# Patient Record
Sex: Female | Born: 1955 | Race: White | Hispanic: No | Smoking: Never smoker
Health system: Southern US, Community
[De-identification: ages and names within clinical notes are randomized; demographics above are authoritative.]

## PROBLEM LIST (undated history)

## (undated) DIAGNOSIS — D696 Thrombocytopenia, unspecified: Secondary | ICD-10-CM

## (undated) DIAGNOSIS — J449 Chronic obstructive pulmonary disease, unspecified: Secondary | ICD-10-CM

## (undated) DIAGNOSIS — F329 Major depressive disorder, single episode, unspecified: Secondary | ICD-10-CM

## (undated) DIAGNOSIS — E079 Disorder of thyroid, unspecified: Secondary | ICD-10-CM

## (undated) DIAGNOSIS — F32A Depression, unspecified: Secondary | ICD-10-CM

## (undated) DIAGNOSIS — J45909 Unspecified asthma, uncomplicated: Secondary | ICD-10-CM

## (undated) DIAGNOSIS — E785 Hyperlipidemia, unspecified: Secondary | ICD-10-CM

## (undated) DIAGNOSIS — R569 Unspecified convulsions: Secondary | ICD-10-CM

## (undated) DIAGNOSIS — G8929 Other chronic pain: Secondary | ICD-10-CM

## (undated) DIAGNOSIS — M1990 Unspecified osteoarthritis, unspecified site: Secondary | ICD-10-CM

## (undated) DIAGNOSIS — K219 Gastro-esophageal reflux disease without esophagitis: Secondary | ICD-10-CM

## (undated) DIAGNOSIS — I10 Essential (primary) hypertension: Secondary | ICD-10-CM

## (undated) DIAGNOSIS — E119 Type 2 diabetes mellitus without complications: Secondary | ICD-10-CM

## (undated) DIAGNOSIS — G43909 Migraine, unspecified, not intractable, without status migrainosus: Secondary | ICD-10-CM

## (undated) HISTORY — PX: CHOLECYSTECTOMY: SHX55

---

## 2014-04-16 HISTORY — PX: CARDIAC SURGERY: SHX584

## 2017-05-14 ENCOUNTER — Observation Stay (HOSPITAL_COMMUNITY)
Admission: EM | Admit: 2017-05-14 | Discharge: 2017-05-17 | Disposition: A | Discharge status: Home / Self Care | Payer: Medicaid - Out of State | Attending: Family Medicine | Admitting: Family Medicine

## 2017-05-14 ENCOUNTER — Other Ambulatory Visit: Payer: Self-pay

## 2017-05-14 ENCOUNTER — Emergency Department (HOSPITAL_COMMUNITY): Payer: Medicaid - Out of State

## 2017-05-14 ENCOUNTER — Encounter (HOSPITAL_COMMUNITY): Payer: Self-pay | Admitting: Emergency Medicine

## 2017-05-14 DIAGNOSIS — R569 Unspecified convulsions: Secondary | ICD-10-CM

## 2017-05-14 DIAGNOSIS — J449 Chronic obstructive pulmonary disease, unspecified: Secondary | ICD-10-CM | POA: Diagnosis not present

## 2017-05-14 DIAGNOSIS — R200 Anesthesia of skin: Secondary | ICD-10-CM | POA: Diagnosis present

## 2017-05-14 DIAGNOSIS — E785 Hyperlipidemia, unspecified: Secondary | ICD-10-CM | POA: Insufficient documentation

## 2017-05-14 DIAGNOSIS — R531 Weakness: Principal | ICD-10-CM | POA: Insufficient documentation

## 2017-05-14 DIAGNOSIS — Z882 Allergy status to sulfonamides status: Secondary | ICD-10-CM | POA: Insufficient documentation

## 2017-05-14 DIAGNOSIS — Z79899 Other long term (current) drug therapy: Secondary | ICD-10-CM | POA: Insufficient documentation

## 2017-05-14 DIAGNOSIS — E039 Hypothyroidism, unspecified: Secondary | ICD-10-CM | POA: Diagnosis not present

## 2017-05-14 DIAGNOSIS — Z79890 Hormone replacement therapy (postmenopausal): Secondary | ICD-10-CM | POA: Insufficient documentation

## 2017-05-14 DIAGNOSIS — Z7982 Long term (current) use of aspirin: Secondary | ICD-10-CM | POA: Diagnosis not present

## 2017-05-14 DIAGNOSIS — N182 Chronic kidney disease, stage 2 (mild): Secondary | ICD-10-CM | POA: Diagnosis not present

## 2017-05-14 DIAGNOSIS — Z91018 Allergy to other foods: Secondary | ICD-10-CM | POA: Diagnosis not present

## 2017-05-14 DIAGNOSIS — F329 Major depressive disorder, single episode, unspecified: Secondary | ICD-10-CM | POA: Diagnosis not present

## 2017-05-14 DIAGNOSIS — E079 Disorder of thyroid, unspecified: Secondary | ICD-10-CM | POA: Diagnosis not present

## 2017-05-14 DIAGNOSIS — Z7951 Long term (current) use of inhaled steroids: Secondary | ICD-10-CM | POA: Insufficient documentation

## 2017-05-14 DIAGNOSIS — Z881 Allergy status to other antibiotic agents status: Secondary | ICD-10-CM | POA: Insufficient documentation

## 2017-05-14 DIAGNOSIS — I10 Essential (primary) hypertension: Secondary | ICD-10-CM | POA: Diagnosis present

## 2017-05-14 DIAGNOSIS — E1122 Type 2 diabetes mellitus with diabetic chronic kidney disease: Secondary | ICD-10-CM | POA: Diagnosis not present

## 2017-05-14 DIAGNOSIS — R262 Difficulty in walking, not elsewhere classified: Secondary | ICD-10-CM | POA: Diagnosis not present

## 2017-05-14 DIAGNOSIS — I129 Hypertensive chronic kidney disease with stage 1 through stage 4 chronic kidney disease, or unspecified chronic kidney disease: Secondary | ICD-10-CM | POA: Insufficient documentation

## 2017-05-14 DIAGNOSIS — R339 Retention of urine, unspecified: Secondary | ICD-10-CM | POA: Diagnosis present

## 2017-05-14 DIAGNOSIS — F32A Depression, unspecified: Secondary | ICD-10-CM | POA: Diagnosis present

## 2017-05-14 DIAGNOSIS — R29898 Other symptoms and signs involving the musculoskeletal system: Secondary | ICD-10-CM | POA: Diagnosis not present

## 2017-05-14 DIAGNOSIS — D61818 Other pancytopenia: Secondary | ICD-10-CM | POA: Diagnosis not present

## 2017-05-14 DIAGNOSIS — G8929 Other chronic pain: Secondary | ICD-10-CM | POA: Diagnosis not present

## 2017-05-14 DIAGNOSIS — K219 Gastro-esophageal reflux disease without esophagitis: Secondary | ICD-10-CM | POA: Insufficient documentation

## 2017-05-14 DIAGNOSIS — Z7984 Long term (current) use of oral hypoglycemic drugs: Secondary | ICD-10-CM | POA: Insufficient documentation

## 2017-05-14 DIAGNOSIS — E119 Type 2 diabetes mellitus without complications: Secondary | ICD-10-CM

## 2017-05-14 DIAGNOSIS — Z880 Allergy status to penicillin: Secondary | ICD-10-CM | POA: Insufficient documentation

## 2017-05-14 DIAGNOSIS — Z792 Long term (current) use of antibiotics: Secondary | ICD-10-CM | POA: Insufficient documentation

## 2017-05-14 HISTORY — DX: Other chronic pain: G89.29

## 2017-05-14 HISTORY — DX: Unspecified convulsions: R56.9

## 2017-05-14 HISTORY — DX: Unspecified osteoarthritis, unspecified site: M19.90

## 2017-05-14 HISTORY — DX: Type 2 diabetes mellitus without complications: E11.9

## 2017-05-14 HISTORY — DX: Chronic obstructive pulmonary disease, unspecified: J44.9

## 2017-05-14 HISTORY — DX: Migraine, unspecified, not intractable, without status migrainosus: G43.909

## 2017-05-14 HISTORY — DX: Depression, unspecified: F32.A

## 2017-05-14 HISTORY — DX: Unspecified asthma, uncomplicated: J45.909

## 2017-05-14 HISTORY — DX: Hyperlipidemia, unspecified: E78.5

## 2017-05-14 HISTORY — DX: Major depressive disorder, single episode, unspecified: F32.9

## 2017-05-14 HISTORY — DX: Thrombocytopenia, unspecified: D69.6

## 2017-05-14 HISTORY — DX: Gastro-esophageal reflux disease without esophagitis: K21.9

## 2017-05-14 HISTORY — DX: Disorder of thyroid, unspecified: E07.9

## 2017-05-14 HISTORY — DX: Essential (primary) hypertension: I10

## 2017-05-14 LAB — BASIC METABOLIC PANEL
Anion gap: 9 (ref 5–15)
BUN: 21 mg/dL — AB (ref 6–20)
CHLORIDE: 101 mmol/L (ref 101–111)
CO2: 26 mmol/L (ref 22–32)
Calcium: 8.7 mg/dL — ABNORMAL LOW (ref 8.9–10.3)
Creatinine, Ser: 1.14 mg/dL — ABNORMAL HIGH (ref 0.44–1.00)
GFR calc Af Amer: 59 mL/min — ABNORMAL LOW (ref >60)
GFR, EST NON AFRICAN AMERICAN: 51 mL/min — AB (ref >60)
GLUCOSE: 82 mg/dL (ref 65–99)
POTASSIUM: 4 mmol/L (ref 3.5–5.1)
Sodium: 136 mmol/L (ref 135–145)

## 2017-05-14 LAB — CBC
HEMATOCRIT: 31.5 % — AB (ref 36.0–46.0)
HEMOGLOBIN: 10.9 g/dL — AB (ref 12.0–15.0)
MCH: 32.9 pg (ref 26.0–34.0)
MCHC: 34.6 g/dL (ref 30.0–36.0)
MCV: 95.2 fL (ref 78.0–100.0)
Platelets: 105 10*3/uL — ABNORMAL LOW (ref 150–400)
RBC: 3.31 MIL/uL — AB (ref 3.87–5.11)
RDW: 13 % (ref 11.5–15.5)
WBC: 3.2 10*3/uL — AB (ref 4.0–10.5)

## 2017-05-14 LAB — TYPE AND SCREEN
ABO/RH(D): O POS
Antibody Screen: NEGATIVE

## 2017-05-14 LAB — ABO/RH: ABO/RH(D): O POS

## 2017-05-14 MED ORDER — TOPIRAMATE 25 MG PO TABS
25.0000 mg | ORAL_TABLET | Freq: Every day | ORAL | Status: DC
  Administered 2017-05-15 – 2017-05-16 (×3): 25 mg via ORAL
  Filled 2017-05-14 (×3): qty 1

## 2017-05-14 MED ORDER — GABAPENTIN 300 MG PO CAPS
300.0000 mg | ORAL_CAPSULE | Freq: Three times a day (TID) | ORAL | Status: DC
  Administered 2017-05-15 – 2017-05-17 (×8): 300 mg via ORAL
  Filled 2017-05-14 (×8): qty 1

## 2017-05-14 MED ORDER — PROMETHAZINE HCL 25 MG PO TABS: 12.5000 mg | ORAL_TABLET | Freq: Four times a day (QID) | ORAL | Status: DC | PRN

## 2017-05-14 MED ORDER — FLUTICASONE FUROATE-VILANTEROL 200-25 MCG/INH IN AEPB
1.0000 | INHALATION_SPRAY | Freq: Every day | RESPIRATORY_TRACT | Status: DC
  Administered 2017-05-15 – 2017-05-17 (×3): 1 via RESPIRATORY_TRACT
  Filled 2017-05-14: qty 28

## 2017-05-14 MED ORDER — LEVOTHYROXINE SODIUM 150 MCG PO TABS
150.0000 ug | ORAL_TABLET | Freq: Every day | ORAL | Status: DC
  Administered 2017-05-15 – 2017-05-17 (×3): 150 ug via ORAL
  Filled 2017-05-14: qty 1
  Filled 2017-05-14 (×2): qty 2
  Filled 2017-05-14: qty 1
  Filled 2017-05-14: qty 2
  Filled 2017-05-14: qty 1
  Filled 2017-05-14: qty 2

## 2017-05-14 MED ORDER — DIVALPROEX SODIUM 500 MG PO DR TAB
1000.0000 mg | DELAYED_RELEASE_TABLET | Freq: Every day | ORAL | Status: DC
  Administered 2017-05-15 – 2017-05-17 (×3): 1000 mg via ORAL
  Filled 2017-05-14 (×3): qty 2

## 2017-05-14 MED ORDER — ATENOLOL 50 MG PO TABS
25.0000 mg | ORAL_TABLET | Freq: Every day | ORAL | Status: DC
  Administered 2017-05-15 – 2017-05-17 (×3): 25 mg via ORAL
  Filled 2017-05-14 (×3): qty 1

## 2017-05-14 MED ORDER — MAGNESIUM OXIDE 400 MG PO TABS: 400.0000 mg | ORAL_TABLET | Freq: Two times a day (BID) | ORAL | Status: DC

## 2017-05-14 MED ORDER — HYDROCODONE-ACETAMINOPHEN 5-325 MG PO TABS
1.0000 | ORAL_TABLET | ORAL | Status: DC | PRN
  Administered 2017-05-16: 2 via ORAL
  Administered 2017-05-17: 1 via ORAL
  Filled 2017-05-14: qty 1
  Filled 2017-05-14: qty 2

## 2017-05-14 MED ORDER — FLUTICASONE PROPIONATE 50 MCG/ACT NA SUSP
1.0000 | Freq: Every day | NASAL | Status: DC
  Administered 2017-05-15 – 2017-05-17 (×3): 1 via NASAL
  Filled 2017-05-14: qty 16

## 2017-05-14 MED ORDER — ACETAMINOPHEN 650 MG RE SUPP: 650.0000 mg | Freq: Four times a day (QID) | RECTAL | Status: DC | PRN

## 2017-05-14 MED ORDER — LORAZEPAM 2 MG/ML IJ SOLN
0.5000 mg | Freq: Once | INTRAMUSCULAR | Status: AC
  Administered 2017-05-14: 0.5 mg via INTRAVENOUS
  Filled 2017-05-14: qty 1

## 2017-05-14 MED ORDER — MONTELUKAST SODIUM 10 MG PO TABS
10.0000 mg | ORAL_TABLET | Freq: Every day | ORAL | Status: DC
  Administered 2017-05-15 – 2017-05-16 (×3): 10 mg via ORAL
  Filled 2017-05-14 (×5): qty 1

## 2017-05-14 MED ORDER — ACETAMINOPHEN 325 MG PO TABS
650.0000 mg | ORAL_TABLET | Freq: Four times a day (QID) | ORAL | Status: DC | PRN
  Administered 2017-05-17: 650 mg via ORAL
  Filled 2017-05-14: qty 2

## 2017-05-14 MED ORDER — SODIUM CHLORIDE 0.9% FLUSH
3.0000 mL | Freq: Two times a day (BID) | INTRAVENOUS | Status: DC
  Administered 2017-05-15 – 2017-05-17 (×5): 3 mL via INTRAVENOUS

## 2017-05-14 MED ORDER — SIMVASTATIN 20 MG PO TABS
20.0000 mg | ORAL_TABLET | Freq: Every day | ORAL | Status: DC
  Administered 2017-05-15 – 2017-05-16 (×3): 20 mg via ORAL
  Filled 2017-05-14 (×4): qty 1

## 2017-05-14 MED ORDER — MAGNESIUM OXIDE 400 (241.3 MG) MG PO TABS
800.0000 mg | ORAL_TABLET | Freq: Every day | ORAL | Status: DC
  Administered 2017-05-15 – 2017-05-17 (×3): 800 mg via ORAL
  Filled 2017-05-14 (×3): qty 2

## 2017-05-14 MED ORDER — SUCRALFATE 1 G PO TABS
1.0000 g | ORAL_TABLET | Freq: Three times a day (TID) | ORAL | Status: DC
  Administered 2017-05-15 – 2017-05-17 (×11): 1 g via ORAL
  Filled 2017-05-14 (×12): qty 1

## 2017-05-14 MED ORDER — PRAZOSIN HCL 1 MG PO CAPS
1.0000 mg | ORAL_CAPSULE | Freq: Every day | ORAL | Status: DC
  Administered 2017-05-15 – 2017-05-16 (×3): 1 mg via ORAL
  Filled 2017-05-14 (×3): qty 1

## 2017-05-14 MED ORDER — SENNOSIDES-DOCUSATE SODIUM 8.6-50 MG PO TABS: 1.0000 | ORAL_TABLET | Freq: Every evening | ORAL | Status: DC | PRN

## 2017-05-14 MED ORDER — CLONAZEPAM 0.5 MG PO TABS
0.5000 mg | ORAL_TABLET | Freq: Two times a day (BID) | ORAL | Status: DC
  Administered 2017-05-15 – 2017-05-17 (×6): 0.5 mg via ORAL
  Filled 2017-05-14 (×6): qty 1

## 2017-05-14 MED ORDER — SODIUM CHLORIDE 0.9% FLUSH: 3.0000 mL | INTRAVENOUS | Status: DC | PRN

## 2017-05-14 MED ORDER — MAGNESIUM OXIDE 400 (241.3 MG) MG PO TABS
400.0000 mg | ORAL_TABLET | Freq: Every day | ORAL | Status: DC
  Administered 2017-05-15 – 2017-05-16 (×3): 400 mg via ORAL
  Filled 2017-05-14 (×3): qty 1

## 2017-05-14 MED ORDER — VENLAFAXINE HCL ER 150 MG PO CP24
150.0000 mg | ORAL_CAPSULE | Freq: Every day | ORAL | Status: DC
  Administered 2017-05-15 – 2017-05-17 (×3): 150 mg via ORAL
  Filled 2017-05-14: qty 2
  Filled 2017-05-14: qty 1
  Filled 2017-05-14 (×2): qty 2
  Filled 2017-05-14 (×2): qty 1

## 2017-05-14 MED ORDER — ASPIRIN EC 81 MG PO TBEC
81.0000 mg | DELAYED_RELEASE_TABLET | Freq: Every day | ORAL | Status: DC
  Administered 2017-05-15 – 2017-05-17 (×3): 81 mg via ORAL
  Filled 2017-05-14 (×3): qty 1

## 2017-05-14 MED ORDER — BUMETANIDE 1 MG PO TABS
1.0000 mg | ORAL_TABLET | ORAL | Status: DC
  Administered 2017-05-15 – 2017-05-17 (×2): 1 mg via ORAL
  Filled 2017-05-14 (×2): qty 1

## 2017-05-14 MED ORDER — DICYCLOMINE HCL 20 MG PO TABS
20.0000 mg | ORAL_TABLET | Freq: Three times a day (TID) | ORAL | Status: DC
  Administered 2017-05-15 – 2017-05-17 (×8): 20 mg via ORAL
  Filled 2017-05-14 (×8): qty 1

## 2017-05-14 MED ORDER — DIVALPROEX SODIUM 250 MG PO DR TAB
750.0000 mg | DELAYED_RELEASE_TABLET | Freq: Every day | ORAL | Status: DC
  Administered 2017-05-15 – 2017-05-16 (×3): 750 mg via ORAL
  Filled 2017-05-14 (×3): qty 3

## 2017-05-14 MED ORDER — VITAMIN B-12 1000 MCG PO TABS
1000.0000 ug | ORAL_TABLET | Freq: Every day | ORAL | Status: DC
  Administered 2017-05-15 – 2017-05-17 (×3): 1000 ug via ORAL
  Filled 2017-05-14 (×3): qty 1

## 2017-05-14 MED ORDER — ISOSORBIDE MONONITRATE ER 30 MG PO TB24
30.0000 mg | ORAL_TABLET | Freq: Every day | ORAL | Status: DC
  Administered 2017-05-15 – 2017-05-17 (×3): 30 mg via ORAL
  Filled 2017-05-14 (×3): qty 1

## 2017-05-14 MED ORDER — SODIUM CHLORIDE 0.9% FLUSH
3.0000 mL | Freq: Two times a day (BID) | INTRAVENOUS | Status: DC
  Administered 2017-05-16 – 2017-05-17 (×3): 3 mL via INTRAVENOUS

## 2017-05-14 MED ORDER — POTASSIUM CHLORIDE CRYS ER 10 MEQ PO TBCR
10.0000 meq | EXTENDED_RELEASE_TABLET | Freq: Every day | ORAL | Status: DC
  Administered 2017-05-15 – 2017-05-17 (×3): 10 meq via ORAL
  Filled 2017-05-14 (×3): qty 1

## 2017-05-14 MED ORDER — PANTOPRAZOLE SODIUM 40 MG PO TBEC
40.0000 mg | DELAYED_RELEASE_TABLET | Freq: Every day | ORAL | Status: DC
  Administered 2017-05-15 – 2017-05-17 (×3): 40 mg via ORAL
  Filled 2017-05-14 (×3): qty 1

## 2017-05-14 MED ORDER — SODIUM CHLORIDE 0.9 % IV SOLN: 250.0000 mL | INTRAVENOUS | Status: DC | PRN

## 2017-05-14 MED ORDER — ALBUTEROL SULFATE (2.5 MG/3ML) 0.083% IN NEBU: 3.0000 mL | INHALATION_SOLUTION | Freq: Four times a day (QID) | RESPIRATORY_TRACT | Status: DC | PRN

## 2017-05-14 MED ORDER — ARIPIPRAZOLE 2 MG PO TABS
2.0000 mg | ORAL_TABLET | Freq: Every day | ORAL | Status: DC
  Administered 2017-05-15 – 2017-05-17 (×3): 2 mg via ORAL
  Filled 2017-05-14 (×3): qty 1

## 2017-05-14 NOTE — H&P (Signed)
History and Physical    Jamie Shea PJA:[id number] DOB: [birth year] DOA: 05/14/2017  PCP: System, Pcp Not In; Tory Emerald, FNP   Patient coming from: Home, by way of [facility]   Chief Complaint: Bilateral leg weakness and numbness   HPI: Jamie Shea is a 62 y.o. female with medical history significant for COPD with chronic hypoxic respiratory failure, seizure disorder, hypertension, depression, anxiety, and chronic pain, now presenting to the emergency to an outside emergency department for evaluation of bilateral lower extremity numbness and weakness.  Patient has reported that she has been experiencing increased pain in both of the lower extremities over the past few months and has fallen a few times secondary to this.  She reports getting up to use the bathroom earlier today, but was unable to walk due to bilateral leg numbness and weakness.  She denies any headache, change in vision or hearing, upper extremity numbness or weakness, or acute low back pain.  No recent fevers or chills.  She was noted to have urinary retention at the outside hospital, Foley catheter was placed, and there was concern for cauda equina, and so the patient was transferred here for MRI.  ED Course: Upon arrival to the ED, patient is found to be afebrile, saturating low 90s, mildly tachypneic, and with vitals otherwise normal.  MRI of the lumbar and thoracic spine are negative for acute abnormality or cord compression.  Mr. panel is notable for a creatinine of 1.14, consistent with her apparent baseline.  CBC features a pancytopenia with WBC 3200, hemoglobin 10.9, and platelets 105,000.  Neurology was consulted by the ED physician and recommends a medical patient has remained hemodynamically stable, is respiratory distress, and will be observed on the telemetry unit for ongoing evaluation and extremity numbness and weakness.  Review of Systems:  All other systems reviewed and apart from HPI, are  negative.  Past Medical History:  Diagnosis Date  . Arthritis   . Asthma   . Chronic pain   . COPD (chronic obstructive pulmonary disease) ([place])   . Depression   . Diabetes mellitus without complication ([place])   . GERD (gastroesophageal reflux disease)   . Hyperlipidemia   . Hypertension   . Migraines   . Seizures ([place])   . Thrombocytopenia ([place])   . Thyroid disease     Past Surgical History:  Procedure Laterality Date  . CARDIAC SURGERY  2016   cath  . CHOLECYSTECTOMY       reports that  has never smoked. she has never used smokeless tobacco. She reports that she does not drink alcohol or use drugs.  Allergies  Allergen Reactions  . Amoxicillin Hives  . Azithromycin Hives  . Corn-Containing Products Diarrhea  . Pea Extract Diarrhea  . Penicillins   . Sulfa Antibiotics     History reviewed. No pertinent family history.   Prior to Admission medications   Medication Sig Start Date End Date Taking? Authorizing Provider  acetaminophen (TYLENOL) 500 MG tablet Take 1,000 mg by mouth every 4 (four) hours as needed for mild pain.   Yes [provider]  albuterol (PROVENTIL HFA;VENTOLIN HFA) 108 (90 Base) MCG/ACT inhaler Inhale 1-2 puffs into the lungs every 6 (six) hours as needed for wheezing or shortness of breath.   Yes [provider]  ARIPiprazole (ABILIFY) 2 MG tablet Take 2 mg by mouth daily.   Yes [provider]  aspirin EC 81 MG tablet Take 81 mg by mouth daily.  Yes [provider]  atenolol (TENORMIN) 50 MG tablet Take 25 mg by mouth daily.   Yes [provider]  azithromycin (ZITHROMAX) 250 MG tablet Take by mouth See admin instructions. Takes one tablet  266m every  Monday, Wednesday,Friday each week   Yes [provider]  bumetanide (BUMEX) 1 MG tablet Take 1 mg by mouth every other day.   Yes [provider]  clonazePAM (KLONOPIN) 1 MG tablet Take 0.5 mg by mouth 2 (two) times daily.   Yes  [provider]  dicyclomine (BENTYL) 20 MG tablet Take 20 mg by mouth 3 (three) times daily before meals.   Yes [provider]  diphenoxylate-atropine (LOMOTIL) 2.5-0.025 MG tablet Take 1 tablet by mouth 3 (three) times daily with meals.   Yes [provider]  ferrous sulfate 325 (65 FE) MG tablet Take 325 mg by mouth 2 (two) times daily with a meal.   Yes [provider]  fluticasone (FLONASE) 50 MCG/ACT nasal spray Place 1 spray into both nostrils daily.   Yes [provider]  fluticasone furoate-vilanterol (BREO ELLIPTA) 200-25 MCG/INH AEPB Inhale 1 puff into the lungs daily.   Yes [provider]  gabapentin (NEURONTIN) 300 MG capsule Take 300 mg by mouth 3 (three) times daily.   Yes [provider]  ibuprofen (ADVIL,MOTRIN) 800 MG tablet Take 800 mg by mouth every 6 (six) hours as needed for moderate pain.   Yes [provider]  isosorbide mononitrate (IMDUR) 30 MG 24 hr tablet Take 30 mg by mouth daily.   Yes [provider]  levothyroxine (SYNTHROID, LEVOTHROID) 150 MCG tablet Take 150 mcg by mouth daily before breakfast.   Yes [provider]  magnesium oxide (MAG-OX) 400 MG tablet Take 400-800 mg by mouth 2 (two) times daily. Patient taking 2 tablets (8029m in the morning and one tablet at night   Yes [provider]  metFORMIN (GLUCOPHAGE) 1000 MG tablet Take 500 mg by mouth 2 (two) times daily with a meal. Takes 50037mOne-half tablet twice daily   Yes [provider]  montelukast (SINGULAIR) 10 MG tablet Take 10 mg by mouth at bedtime.   Yes [provider]  nitroGLYCERIN (NITROSTAT) 0.4 MG SL tablet Place 0.4 mg under the tongue every 5 (five) minutes as needed for chest pain.   Yes [provider]  nystatin cream (MYCOSTATIN) Apply 1 application topically 2 (two) times daily.   Yes [provider]  OXYGEN Inhale 2 L into the lungs at bedtime.   Yes  [provider]  pantoprazole (PROTONIX) 40 MG tablet Take 40 mg by mouth daily.   Yes [provider]  potassium chloride (K-DUR) 10 MEQ tablet Take 10 mEq by mouth daily.   Yes [provider]  prazosin (MINIPRESS) 1 MG capsule Take 1 mg by mouth at bedtime.   Yes [provider]  simvastatin (ZOCOR) 20 MG tablet Take 20 mg by mouth daily at 6 PM.   Yes [provider]  sucralfate (CARAFATE) 1 g tablet Take 1 g by mouth 4 (four) times daily -  with meals and at bedtime.   Yes [provider]  topiramate (TOPAMAX) 25 MG tablet Take 25 mg by mouth at bedtime.   Yes [provider]  Valproic Acid 250 MG CPDR Take 750-1,000 mg by mouth 2 (two) times daily. Takes four capsules (1000 mg) in the morning and 3 capsules (750m52mt bedtime.   Yes [provider]  Venlafaxine HCl 150 MG TB24 Take 150 mg by mouth daily.   Yes [provider]  vitamin B-12 (CYANOCOBALAMIN) 1000 MCG tablet Take 1,000 mcg by mouth daily.   Yes [provider]  Vitamin D, Ergocalciferol, (DRISDOL) 50000 units CAPS capsule Take 50,000 Units by mouth every 7 (seven) days.   Yes [provider]    Physical Exam: Vitals:   05/14/17 1700 05/14/17 1715 05/14/17 1730 05/14/17 1800  BP: 116/63 121/67 130/73 114/68  Pulse: (!) 54 (!) 51 (!) 52 (!) 56  Resp: _0 Temp:      TempSrc:      SpO2: 95% 95% 97% 97%  Weight:      Height:          Constitutional: NAD, calm, obese Eyes: PERTLA, lids and conjunctivae normal ENMT: Mucous membranes are moist. Posterior pharynx clear of any exudate or lesions.   Neck: normal, supple, no masses, no thyromegaly Respiratory: Breath sounds diminished bilaterally, no wheezing, no crackles. Normal respiratory effort.    Cardiovascular: S1 & S2 heard, regular rate and rhythm. No significant JVD. Abdomen: No distension, no tenderness, no masses palpated. Bowel sounds normal.    Musculoskeletal: no clubbing / cyanosis. No joint deformity upper and lower extremities.  Skin: no significant rashes, lesions, ulcers. Warm, dry, well-perfused. Neurologic: No facial asymmetry. Sensation to light touch diminished throughout the bilateral LE's. Strength 5/5 in upper extremities, 2-3/5 throughout bilateral LE's.  Psychiatric: Alert. Oriented x 3.     Labs on Admission: I have personally reviewed following labs and imaging studies  CBC: Recent Labs  Lab 05/14/17 0950  WBC 3.2*  HGB 10.9*  HCT 31.5*  MCV 95.2  PLT 481*   Basic Metabolic Panel: Recent Labs  Lab 05/14/17 0950  NA 136  K 4.0  CL 101  CO2 26  GLUCOSE 82  BUN 21*  CREATININE 1.14*  CALCIUM 8.7*   GFR: Estimated Creatinine Clearance: 58.7 mL/min (A) (by C-G formula based on SCr of 1.14 mg/dL (H)). Liver Function Tests: No results for input(s): AST, ALT, ALKPHOS, BILITOT, PROT, ALBUMIN in the last 168 hours. No results for input(s): LIPASE, AMYLASE in the last 168 hours. No results for input(s): AMMONIA in the last 168 hours. Coagulation Profile: No results for input(s): INR, PROTIME in the last 168 hours. Cardiac Enzymes: No results for input(s): CKTOTAL, CKMB, CKMBINDEX, TROPONINI in the last 168 hours. BNP (last 3 results) No results for input(s): PROBNP in the last 8760 hours. HbA1C: No results for input(s): HGBA1C in the last 72 hours. CBG: No results for input(s): GLUCAP in the last 168 hours. Lipid Profile: No results for input(s): CHOL, HDL, LDLCALC, TRIG, CHOLHDL, LDLDIRECT in the last 72 hours. Thyroid Function Tests: No results for input(s): TSH, T4TOTAL, FREET4, T3FREE, THYROIDAB in the last 72 hours. Anemia Panel: No results for input(s): VITAMINB12, FOLATE, FERRITIN, TIBC, IRON, RETICCTPCT in the last 72 hours. Urine analysis: No results found for: COLORURINE, APPEARANCEUR, LABSPEC, PHURINE, GLUCOSEU, HGBUR, BILIRUBINUR, KETONESUR, PROTEINUR, UROBILINOGEN, NITRITE,  LEUKOCYTESUR Sepsis Labs: _1 (procalcitonin:4,lacticidven:4) )No results found for this or any previous visit (from the past 240 hour(s)).   Radiological Exams on Admission: Mr Thoracic Spine Wo Contrast  Result Date: 05/14/2017 CLINICAL DATA:  Initial evaluation for acute loss of feeling to bilateral lower extremities with fall last night. EXAM: MRI THORACIC AND LUMBAR SPINE WITHOUT CONTRAST TECHNIQUE: Multiplanar and multiecho pulse sequences of the thoracic and lumbar spine were obtained without intravenous contrast.  COMPARISON:  None. FINDINGS: MRI THORACIC SPINE FINDINGS Alignment: Vertebral bodies normally aligned with preservation of the normal thoracic kyphosis. No listhesis. Vertebrae: Vertebral body heights are maintained without evidence for acute or chronic fracture. Bone marrow signal intensity within normal limits. Mild reactive endplate changes noted about the T9-10 interspace anteriorly. Subcentimeter benign hemangioma noted within the T12 vertebral body. No other discrete or worrisome osseous lesions. Cord: Signal intensity within the thoracic spinal cord is normal. Conus medullaris terminates below the T12 level. Paraspinal and other soft tissues: Paraspinous soft tissues demonstrate no acute abnormality. Patchy opacity noted within the posterior right upper lobe, which may reflect atelectasis or infiltrate. Disc levels: No significant disc pathology seen within the thoracic spine. Mild bilateral facet hypertrophy seen from the T7-8 through T11-12 levels. No significant canal stenosis. Mild bilateral foraminal narrowing at T8-9. MRI LUMBAR SPINE FINDINGS Segmentation: Normal segmentation. Lowest well-formed disc labeled the L5-S1 level. Alignment: Trace retrolisthesis of L2 on L3. Vertebral bodies otherwise normally aligned with preservation of the normal lumbar lordosis. Vertebrae: Vertebral body heights are well maintained without evidence for acute or chronic fracture. Bone marrow  signal intensity within normal limits. Mild reactive endplate changes noted about the L2-3 interspace anteriorly. No discrete or worrisome osseous lesions. No abnormal marrow edema. Conus medullaris and cauda equina: Conus extends to the L1 level. Conus and cauda equina appear normal. Paraspinal and other soft tissues: Paraspinous soft tissues within normal limits. Visualized visceral structures unremarkable. Disc levels: L1-2:  Unremarkable. L2-3: Trace retrolisthesis. Mild diffuse disc bulge with disc desiccation and intervertebral disc space narrowing. Mild facet and ligament flavum hypertrophy. No significant canal or foraminal stenosis. L3-4: Mild diffuse disc bulge, slightly eccentric to the left. Mild bilateral facet and ligament flavum hypertrophy. Mild left lateral recess narrowing without significant canal stenosis. No significant foraminal encroachment. L4-5: Minimal disc bulge. Mild to moderate facet and ligamentum flavum hypertrophy. Trace joint effusion present on the right. No significant canal stenosis. Mild right L4 foraminal narrowing. No significant left foraminal encroachment. L5-S1: Mild diffuse disc bulge with disc desiccation. Associated chronic reactive endplate changes with marginal endplate osteophytic spurring. Mild facet and ligament flavum hypertrophy. No significant canal or lateral recess stenosis. Mild bilateral L5 foraminal narrowing. IMPRESSION: MR THORACIC SPINE IMPRESSION 1. No acute abnormality within the thoracic spine. No evidence for cord compression. 2. Mild bilateral facet hypertrophy at T7-8 through T11-12 without significant stenosis. MR LUMBAR SPINE IMPRESSION 1. No acute abnormality within the lumbar spine. No evidence for cord compression. 2. Mild degenerative disc disease for age without significant canal stenosis. 3. Mild right L4 and bilateral L5 foraminal stenosis related to disc bulge and facet disease. Electronically Signed   By: Jeannine Boga M.D.   On:  05/14/2017 14:52   Mr Lumbar Spine Wo Contrast  Result Date: 05/14/2017 CLINICAL DATA:  Initial evaluation for acute loss of feeling to bilateral lower extremities with fall last night. EXAM: MRI THORACIC AND LUMBAR SPINE WITHOUT CONTRAST TECHNIQUE: Multiplanar and multiecho pulse sequences of the thoracic and lumbar spine were obtained without intravenous contrast. COMPARISON:  None. FINDINGS: MRI THORACIC SPINE FINDINGS Alignment: Vertebral bodies normally aligned with preservation of the normal thoracic kyphosis. No listhesis. Vertebrae: Vertebral body heights are maintained without evidence for acute or chronic fracture. Bone marrow signal intensity within normal limits. Mild reactive endplate changes noted about the T9-10 interspace anteriorly. Subcentimeter benign hemangioma noted within the T12 vertebral body. No other discrete or worrisome osseous lesions. Cord: Signal intensity within the thoracic spinal  cord is normal. Conus medullaris terminates below the T12 level. Paraspinal and other soft tissues: Paraspinous soft tissues demonstrate no acute abnormality. Patchy opacity noted within the posterior right upper lobe, which may reflect atelectasis or infiltrate. Disc levels: No significant disc pathology seen within the thoracic spine. Mild bilateral facet hypertrophy seen from the T7-8 through T11-12 levels. No significant canal stenosis. Mild bilateral foraminal narrowing at T8-9. MRI LUMBAR SPINE FINDINGS Segmentation: Normal segmentation. Lowest well-formed disc labeled the L5-S1 level. Alignment: Trace retrolisthesis of L2 on L3. Vertebral bodies otherwise normally aligned with preservation of the normal lumbar lordosis. Vertebrae: Vertebral body heights are well maintained without evidence for acute or chronic fracture. Bone marrow signal intensity within normal limits. Mild reactive endplate changes noted about the L2-3 interspace anteriorly. No discrete or worrisome osseous lesions. No abnormal  marrow edema. Conus medullaris and cauda equina: Conus extends to the L1 level. Conus and cauda equina appear normal. Paraspinal and other soft tissues: Paraspinous soft tissues within normal limits. Visualized visceral structures unremarkable. Disc levels: L1-2:  Unremarkable. L2-3: Trace retrolisthesis. Mild diffuse disc bulge with disc desiccation and intervertebral disc space narrowing. Mild facet and ligament flavum hypertrophy. No significant canal or foraminal stenosis. L3-4: Mild diffuse disc bulge, slightly eccentric to the left. Mild bilateral facet and ligament flavum hypertrophy. Mild left lateral recess narrowing without significant canal stenosis. No significant foraminal encroachment. L4-5: Minimal disc bulge. Mild to moderate facet and ligamentum flavum hypertrophy. Trace joint effusion present on the right. No significant canal stenosis. Mild right L4 foraminal narrowing. No significant left foraminal encroachment. L5-S1: Mild diffuse disc bulge with disc desiccation. Associated chronic reactive endplate changes with marginal endplate osteophytic spurring. Mild facet and ligament flavum hypertrophy. No significant canal or lateral recess stenosis. Mild bilateral L5 foraminal narrowing. IMPRESSION: MR THORACIC SPINE IMPRESSION 1. No acute abnormality within the thoracic spine. No evidence for cord compression. 2. Mild bilateral facet hypertrophy at T7-8 through T11-12 without significant stenosis. MR LUMBAR SPINE IMPRESSION 1. No acute abnormality within the lumbar spine. No evidence for cord compression. 2. Mild degenerative disc disease for age without significant canal stenosis. 3. Mild right L4 and bilateral L5 foraminal stenosis related to disc bulge and facet disease. Electronically Signed   By: Jeannine Boga M.D.   On: 05/14/2017 14:52    EKG: Not performed.   Assessment/Plan  1. Bilateral leg weakness - Presents with numbness and weakness involving bilateral LE's, sent from  outside ED for MRI lumbar spine  - Normal ESR and CRP and non-acute head CT at outside hospital - MRI lumbar and thoracic spine negative for acute abnormality or cord-compression  - Neurology is consulting and much appreciated, will follow-up on recommendations, ask PT to eval and tx, continue supportive care    2. COPD, chronic hypoxic respiratory failure  - Stable on admission with no dyspnea or wheezing  - Continue ICS/LABA, supplemental O2, and prn albuterol   3. Depression, anxiety  - Continue Effexor, Klonopin    4. Seizure disorder  - Continue Topamax and Depakote   5. Hypothyroidism  - Continue Synthroid   6. Pancytopenia  - WBC is 3,200 on admission with Hgb 10.9 and platelets 105,000  - She has had intermittent leukopenia and normocytic anemia going back many years, and has documented hx of thrombocytopenia  - No evidence for infection or bleeding on admission  - Type and screen has been performed, will check anemia panel    7. CKD stage II  - SCr  is 1.14 on admission, consistent with her apparent baseline  - Renally-dose medications, avoid nephrotoxins    DVT prophylaxis: SCD's  Code Status: Full  Family Communication: Discussed with patient Disposition Plan: Observe on telemetry Consults called: Neurology Admission status: Observation   Vianne Bulls, MD [facility] Pager [phone]  If 7PM-7AM, please contact night-coverage [email or web address] Password [email or web address]  05/14/2017, 7:50 PM

## 2017-05-14 NOTE — Consult Note (Signed)
NEURO HOSPITALIST CONSULT NOTE   Requestig physician: Dr. Clarene Duke   Reason for Consult:   abnormal gait   History obtained from: Unclear patient is encephalopathic but cannot give a good history  HPI:                                                                                                                                          Jamie Shea is an 62 y.o. female with history of thrombocytopenia, thyroid disease, seizures, migraines, hypertension, hyperlipidemia, depression, chronic pain, and psychotic features.  I called the daughter who stated that yesterday she was walking to the bathroom and she was doing fine.  Apparently this morning she went to the bathroom and when she called her daughter to get out of the bed she slid off the bed and stated she could not feel or move her legs. "However the daughter did state that when the ambulance came she was able to move her legs and wiggle her toes and move her ankles."  When asked if she does this often due to her depression, the daughter did state that she often times does this with her.  She will state that she cannot do something such as moving a limb however if distracted or left alone the room and looked in the room she does move these extremities.  For this reason she now goes all the doctor's office because she is afraid that some of the information is not true.  Currently patient is extremely drowsy secondary to 2 mg of Ativan, she cannot give me a good history, she in a properly does not answer any questions that I ask her.  The questions that I asked her than answered with answers that make absolutely no correlation with what I asked.   Labs of importance BUN 21 Creatinine 1.14 Calcium 8.7 White blood cell count 3.2  Past Medical History:  Diagnosis Date  . Arthritis   . Asthma   . Chronic pain   . COPD (chronic obstructive pulmonary disease) (HCC)   . Depression   . Diabetes mellitus without  complication (HCC)   . GERD (gastroesophageal reflux disease)   . Hyperlipidemia   . Hypertension   . Migraines   . Seizures (HCC)   . Thrombocytopenia (HCC)   . Thyroid disease     Past Surgical History:  Procedure Laterality Date  . CARDIAC SURGERY  2016   cath  . CHOLECYSTECTOMY      No family history on file.   Social History:  reports that  has never smoked. she has never used smokeless tobacco. She reports that she does not drink alcohol or use drugs.  Allergies  Allergen Reactions  . Amoxicillin Hives  . Azithromycin Hives  . Corn-Containing Products Diarrhea  .  Pea Extract Diarrhea  . Penicillins   . Sulfa Antibiotics     MEDICATIONS:                                                                                                                     No current facility-administered medications for this encounter.    Current Outpatient Medications  Medication Sig Dispense Refill  . acetaminophen (TYLENOL) 500 MG tablet Take 1,000 mg by mouth every 4 (four) hours as needed for mild pain.    Marland Kitchen. albuterol (PROVENTIL HFA;VENTOLIN HFA) 108 (90 Base) MCG/ACT inhaler Inhale 1-2 puffs into the lungs every 6 (six) hours as needed for wheezing or shortness of breath.    . ARIPiprazole (ABILIFY) 2 MG tablet Take 2 mg by mouth daily.    Marland Kitchen. aspirin EC 81 MG tablet Take 81 mg by mouth daily.    Marland Kitchen. atenolol (TENORMIN) 50 MG tablet Take 25 mg by mouth daily.    Marland Kitchen. azithromycin (ZITHROMAX) 250 MG tablet Take by mouth See admin instructions. Takes one tablet  250mg  every  Monday, Wednesday,Friday each week    . bumetanide (BUMEX) 1 MG tablet Take 1 mg by mouth every other day.    . clonazePAM (KLONOPIN) 1 MG tablet Take 0.5 mg by mouth 2 (two) times daily.    Marland Kitchen. dicyclomine (BENTYL) 20 MG tablet Take 20 mg by mouth 3 (three) times daily before meals.    . diphenoxylate-atropine (LOMOTIL) 2.5-0.025 MG tablet Take 1 tablet by mouth 3 (three) times daily with meals.    . ferrous sulfate  325 (65 FE) MG tablet Take 325 mg by mouth 2 (two) times daily with a meal.    . fluticasone (FLONASE) 50 MCG/ACT nasal spray Place 1 spray into both nostrils daily.    . fluticasone furoate-vilanterol (BREO ELLIPTA) 200-25 MCG/INH AEPB Inhale 1 puff into the lungs daily.    Marland Kitchen. gabapentin (NEURONTIN) 300 MG capsule Take 300 mg by mouth 3 (three) times daily.    Marland Kitchen. ibuprofen (ADVIL,MOTRIN) 800 MG tablet Take 800 mg by mouth every 6 (six) hours as needed for moderate pain.    . isosorbide mononitrate (IMDUR) 30 MG 24 hr tablet Take 30 mg by mouth daily.    Marland Kitchen. levothyroxine (SYNTHROID, LEVOTHROID) 150 MCG tablet Take 150 mcg by mouth daily before breakfast.    . magnesium oxide (MAG-OX) 400 MG tablet Take 400-800 mg by mouth 2 (two) times daily. Patient taking 2 tablets (800mg ) in the morning and one tablet at night    . metFORMIN (GLUCOPHAGE) 1000 MG tablet Take 500 mg by mouth 2 (two) times daily with a meal. Takes 500mg   One-half tablet twice daily    . montelukast (SINGULAIR) 10 MG tablet Take 10 mg by mouth at bedtime.    . nitroGLYCERIN (NITROSTAT) 0.4 MG SL tablet Place 0.4 mg under the tongue every 5 (five) minutes as needed for chest pain.    Marland Kitchen. nystatin cream (MYCOSTATIN) Apply 1 application topically 2 (two) times daily.    .Marland Kitchen  OXYGEN Inhale 2 L into the lungs at bedtime.    . pantoprazole (PROTONIX) 40 MG tablet Take 40 mg by mouth daily.    . potassium chloride (K-DUR) 10 MEQ tablet Take 10 mEq by mouth daily.    . prazosin (MINIPRESS) 1 MG capsule Take 1 mg by mouth at bedtime.    . simvastatin (ZOCOR) 20 MG tablet Take 20 mg by mouth daily at 6 PM.    . sucralfate (CARAFATE) 1 g tablet Take 1 g by mouth 4 (four) times daily -  with meals and at bedtime.    . topiramate (TOPAMAX) 25 MG tablet Take 25 mg by mouth at bedtime.    . Valproic Acid 250 MG CPDR Take 750-1,000 mg by mouth 2 (two) times daily. Takes four capsules (1000 mg) in the morning and 3 capsules (750mg ) at bedtime.    .  Venlafaxine HCl 150 MG TB24 Take 150 mg by mouth daily.    . vitamin B-12 (CYANOCOBALAMIN) 1000 MCG tablet Take 1,000 mcg by mouth daily.    . Vitamin D, Ergocalciferol, (DRISDOL) 50000 units CAPS capsule Take 50,000 Units by mouth every 7 (seven) days.        ROS:                                                                                                                                       History obtained from unobtainable from patient due to Patient not coherent enough to answer these questions, daughter did answer some questions  General ROS: negative for - chills, fatigue, fever, night sweats, weight gain or weight loss Psychological ROS: Positive for - behavioral disorder, Musculoskeletal ROS: Positive for - muscular weakness Neurological ROS: as noted in HPI Dermatological ROS: negative for rash and skin lesion changes   Blood pressure 112/62, pulse (!) 57, temperature (!) 97.5 F (36.4 C), temperature source Oral, resp. rate 12, height 5\' 4"  (1.626 m), weight 95.3 kg (210 lb), SpO2 94 %.   General Examination:                                                                                                       Physical Exam  HEENT-  Normocephalic, no lesions, without obvious abnormality.  Normal external eye and conjunctiva.   Cardiovascular- S1-S2 audible, pulses palpable throughout   Lungs-no rhonchi or wheezing noted, no excessive working breathing.  Saturations within normal limits Abdomen- All 4 quadrants palpated and nontender Extremities- Warm, dry and intact  Musculoskeletal-positive joint tenderness, deformity or swelling Skin-warm and dry, no hyperpigmentation, vitiligo, or suspicious lesions  Neurological Examination Mental Status: Alert, she knows that she is at the hospital but states that she cannot give me any more information.  Speech dysarthric without evidence of aphasia.  Only follows certain to any commands and of those commands are simple  cranial  Nerves: II: Blink to threat bilaterally and is able to count my fingers in all 4 quadrants III,IV, VI: ptosis not present, extra-ocular motions intact bilaterally pupils equal, round, reactive to light and accommodation V,VII: smile symmetric, facial light touch sensation normal bilaterally VIII: hearing normal bilaterally IX,X: uvula rises symmetrically XI: bilateral shoulder shrug XII: midline tongue extension Motor: She is able to move bilateral arms with 5/5 strength.  She does not move her legs even to pain.  She does have significant crepitus when I passively bend her knees.  When asked to push on my hands with plantar flexion she can do so with 4/5 strength.  She also can dorsiflex her ankle at 4/5. Sensory: With noxious stimuli/pinching skin patient states that she cannot feel any sensation up to her arms. (Daughter states that she will often do this and has a high pain threshold) Deep Tendon Reflexes: 2+ and symmetric throughout upper extremities and lower extremities Plantars: Right: downgoing   Left: downgoing Cerebellar: normal finger-to-nose, Gait: Not tested   Lab Results: Basic Metabolic Panel: Recent Labs  Lab 05/14/17 0950  NA 136  K 4.0  CL 101  CO2 26  GLUCOSE 82  BUN 21*  CREATININE 1.14*  CALCIUM 8.7*    CBC: Recent Labs  Lab 05/14/17 0950  WBC 3.2*  HGB 10.9*  HCT 31.5*  MCV 95.2  PLT 105*    Cardiac Enzymes: No results for input(s): CKTOTAL, CKMB, CKMBINDEX, TROPONINI in the last 168 hours.  Lipid Panel: No results for input(s): CHOL, TRIG, HDL, CHOLHDL, VLDL, LDLCALC in the last 168 hours.  Imaging: Mr Thoracic Spine Wo Contrast  Result Date: 05/14/2017 CLINICAL DATA:  Initial evaluation for acute loss of feeling to bilateral lower extremities with fall last night. EXAM: MRI THORACIC AND LUMBAR SPINE WITHOUT CONTRAST TECHNIQUE: Multiplanar and multiecho pulse sequences of the thoracic and lumbar spine were obtained without intravenous  contrast. COMPARISON:  None. FINDINGS: MRI THORACIC SPINE FINDINGS Alignment: Vertebral bodies normally aligned with preservation of the normal thoracic kyphosis. No listhesis. Vertebrae: Vertebral body heights are maintained without evidence for acute or chronic fracture. Bone marrow signal intensity within normal limits. Mild reactive endplate changes noted about the T9-10 interspace anteriorly. Subcentimeter benign hemangioma noted within the T12 vertebral body. No other discrete or worrisome osseous lesions. Cord: Signal intensity within the thoracic spinal cord is normal. Conus medullaris terminates below the T12 level. Paraspinal and other soft tissues: Paraspinous soft tissues demonstrate no acute abnormality. Patchy opacity noted within the posterior right upper lobe, which may reflect atelectasis or infiltrate. Disc levels: No significant disc pathology seen within the thoracic spine. Mild bilateral facet hypertrophy seen from the T7-8 through T11-12 levels. No significant canal stenosis. Mild bilateral foraminal narrowing at T8-9. MRI LUMBAR SPINE FINDINGS Segmentation: Normal segmentation. Lowest well-formed disc labeled the L5-S1 level. Alignment: Trace retrolisthesis of L2 on L3. Vertebral bodies otherwise normally aligned with preservation of the normal lumbar lordosis. Vertebrae: Vertebral body heights are well maintained without evidence for acute or chronic fracture. Bone marrow signal intensity within normal limits. Mild reactive endplate changes noted about the L2-3 interspace anteriorly. No discrete  or worrisome osseous lesions. No abnormal marrow edema. Conus medullaris and cauda equina: Conus extends to the L1 level. Conus and cauda equina appear normal. Paraspinal and other soft tissues: Paraspinous soft tissues within normal limits. Visualized visceral structures unremarkable. Disc levels: L1-2:  Unremarkable. L2-3: Trace retrolisthesis. Mild diffuse disc bulge with disc desiccation and  intervertebral disc space narrowing. Mild facet and ligament flavum hypertrophy. No significant canal or foraminal stenosis. L3-4: Mild diffuse disc bulge, slightly eccentric to the left. Mild bilateral facet and ligament flavum hypertrophy. Mild left lateral recess narrowing without significant canal stenosis. No significant foraminal encroachment. L4-5: Minimal disc bulge. Mild to moderate facet and ligamentum flavum hypertrophy. Trace joint effusion present on the right. No significant canal stenosis. Mild right L4 foraminal narrowing. No significant left foraminal encroachment. L5-S1: Mild diffuse disc bulge with disc desiccation. Associated chronic reactive endplate changes with marginal endplate osteophytic spurring. Mild facet and ligament flavum hypertrophy. No significant canal or lateral recess stenosis. Mild bilateral L5 foraminal narrowing. IMPRESSION: MR THORACIC SPINE IMPRESSION 1. No acute abnormality within the thoracic spine. No evidence for cord compression. 2. Mild bilateral facet hypertrophy at T7-8 through T11-12 without significant stenosis. MR LUMBAR SPINE IMPRESSION 1. No acute abnormality within the lumbar spine. No evidence for cord compression. 2. Mild degenerative disc disease for age without significant canal stenosis. 3. Mild right L4 and bilateral L5 foraminal stenosis related to disc bulge and facet disease. Electronically Signed   By: Rise Mu M.D.   On: 05/14/2017 14:52   Mr Lumbar Spine Wo Contrast  Result Date: 05/14/2017 CLINICAL DATA:  Initial evaluation for acute loss of feeling to bilateral lower extremities with fall last night. EXAM: MRI THORACIC AND LUMBAR SPINE WITHOUT CONTRAST TECHNIQUE: Multiplanar and multiecho pulse sequences of the thoracic and lumbar spine were obtained without intravenous contrast. COMPARISON:  None. FINDINGS: MRI THORACIC SPINE FINDINGS Alignment: Vertebral bodies normally aligned with preservation of the normal thoracic kyphosis.  No listhesis. Vertebrae: Vertebral body heights are maintained without evidence for acute or chronic fracture. Bone marrow signal intensity within normal limits. Mild reactive endplate changes noted about the T9-10 interspace anteriorly. Subcentimeter benign hemangioma noted within the T12 vertebral body. No other discrete or worrisome osseous lesions. Cord: Signal intensity within the thoracic spinal cord is normal. Conus medullaris terminates below the T12 level. Paraspinal and other soft tissues: Paraspinous soft tissues demonstrate no acute abnormality. Patchy opacity noted within the posterior right upper lobe, which may reflect atelectasis or infiltrate. Disc levels: No significant disc pathology seen within the thoracic spine. Mild bilateral facet hypertrophy seen from the T7-8 through T11-12 levels. No significant canal stenosis. Mild bilateral foraminal narrowing at T8-9. MRI LUMBAR SPINE FINDINGS Segmentation: Normal segmentation. Lowest well-formed disc labeled the L5-S1 level. Alignment: Trace retrolisthesis of L2 on L3. Vertebral bodies otherwise normally aligned with preservation of the normal lumbar lordosis. Vertebrae: Vertebral body heights are well maintained without evidence for acute or chronic fracture. Bone marrow signal intensity within normal limits. Mild reactive endplate changes noted about the L2-3 interspace anteriorly. No discrete or worrisome osseous lesions. No abnormal marrow edema. Conus medullaris and cauda equina: Conus extends to the L1 level. Conus and cauda equina appear normal. Paraspinal and other soft tissues: Paraspinous soft tissues within normal limits. Visualized visceral structures unremarkable. Disc levels: L1-2:  Unremarkable. L2-3: Trace retrolisthesis. Mild diffuse disc bulge with disc desiccation and intervertebral disc space narrowing. Mild facet and ligament flavum hypertrophy. No significant canal or foraminal stenosis. L3-4: Mild diffuse  disc bulge, slightly  eccentric to the left. Mild bilateral facet and ligament flavum hypertrophy. Mild left lateral recess narrowing without significant canal stenosis. No significant foraminal encroachment. L4-5: Minimal disc bulge. Mild to moderate facet and ligamentum flavum hypertrophy. Trace joint effusion present on the right. No significant canal stenosis. Mild right L4 foraminal narrowing. No significant left foraminal encroachment. L5-S1: Mild diffuse disc bulge with disc desiccation. Associated chronic reactive endplate changes with marginal endplate osteophytic spurring. Mild facet and ligament flavum hypertrophy. No significant canal or lateral recess stenosis. Mild bilateral L5 foraminal narrowing. IMPRESSION: MR THORACIC SPINE IMPRESSION 1. No acute abnormality within the thoracic spine. No evidence for cord compression. 2. Mild bilateral facet hypertrophy at T7-8 through T11-12 without significant stenosis. MR LUMBAR SPINE IMPRESSION 1. No acute abnormality within the lumbar spine. No evidence for cord compression. 2. Mild degenerative disc disease for age without significant canal stenosis. 3. Mild right L4 and bilateral L5 foraminal stenosis related to disc bulge and facet disease. Electronically Signed   By: Rise Mu M.D.   On: 05/14/2017 14:52    Assessment and plan per attending neurologist  Felicie Morn PA-C [facility] [phone]  05/14/2017, 3:38 PM   NEUROHOSPITALIST ADDENDUM Seen and examined the patient today. Formulated plan as documented above by PAC.   ASSESSMENT AND PLAN   Acute paraplegia  MRI T and L spine show no compressive lesion/inflammation of the spinal cord Patient reflexes intact and given sudden presentation  unlikely to GB syndrome.  Recommendations  PT/OT  B12, Folate, TSH F/U UA, metabolic and infection  Will continue to follow   Georgiana Spinner Yarlin Breisch MD [facility] [phone]  If 7pm to 7am, please call on call as listed on  [facility].

## 2017-05-14 NOTE — ED Provider Notes (Signed)
[facility] EMERGENCY DEPARTMENT Provider Note   CSN: [id number] Arrival date & time: 05/14/17  0753     History   Chief Complaint Chief Complaint  Patient presents with  . Numbness    HPI Jamie Shea is a 62 y.o. female.  62 year old female with past medical history including type 2 diabetes mellitus, COPD, seizures, hypertension, depression, chronic pain who presents with leg weakness.  Pt is a poor historian. The patient was transferred here from an outside hospital where she presented overnight with leg weakness after a fall.  She reported to the outside facility that she fell this evening while trying to go the bathroom and has been unable to move her legs with decreased sensation in her legs.  To me, she states that she has had a few falls recently including a fall a few days ago.  She thinks that her legs have been getting weaker.  She reports several months of bilateral pain in her legs.  She denies any focal back pain but notes that she has degenerative changes in her spine.  She reports some recent problems with initiating urinary stream.  No episodes of urinary or fecal incontinence. She denies recent fever, vomiting, or unintentional weight loss.  At outside hospital she was noted to be retaining urine with 121m residual, thus foley was placed. Rectal sphincter tone was present but they noted perineal numbness.   The history is provided by the patient and medical records.    Past Medical History:  Diagnosis Date  . Arthritis   . Asthma   . Chronic pain   . COPD (chronic obstructive pulmonary disease) ([facility]   . Depression   . Diabetes mellitus without complication ([facility]   . GERD (gastroesophageal reflux disease)   . Hyperlipidemia   . Hypertension   . Migraines   . Seizures ([facility]   . Thrombocytopenia ([facility]   . Thyroid disease     There are no active problems to display for this patient.   Past Surgical History:  Procedure Laterality  Date  . CARDIAC SURGERY  2016   cath  . CHOLECYSTECTOMY      OB History    No data available       Home Medications    Prior to Admission medications   Medication Sig Start Date End Date Taking? Authorizing Provider  acetaminophen (TYLENOL) 500 MG tablet Take 1,000 mg by mouth every 4 (four) hours as needed for mild pain.   Yes [provider]  albuterol (PROVENTIL HFA;VENTOLIN HFA) 108 (90 Base) MCG/ACT inhaler Inhale 1-2 puffs into the lungs every 6 (six) hours as needed for wheezing or shortness of breath.   Yes [provider]  ARIPiprazole (ABILIFY) 2 MG tablet Take 2 mg by mouth daily.   Yes [provider]  aspirin EC 81 MG tablet Take 81 mg by mouth daily.   Yes [provider]  atenolol (TENORMIN) 50 MG tablet Take 25 mg by mouth daily.   Yes [provider]  azithromycin (ZITHROMAX) 250 MG tablet Take by mouth See admin instructions. Takes one tablet  2551mevery  Monday, Wednesday,Friday each week   Yes [provider]  bumetanide (BUMEX) 1 MG tablet Take 1 mg by mouth every other day.   Yes [provider]  clonazePAM (KLONOPIN) 1 MG tablet Take 0.5 mg by mouth 2 (two) times daily.   Yes [provider]  dicyclomine (BENTYL) 20 MG tablet Take 20 mg by mouth  3 (three) times daily before meals.   Yes [provider]  diphenoxylate-atropine (LOMOTIL) 2.5-0.025 MG tablet Take 1 tablet by mouth 3 (three) times daily with meals.   Yes [provider]  ferrous sulfate 325 (65 FE) MG tablet Take 325 mg by mouth 2 (two) times daily with a meal.   Yes [provider]  fluticasone (FLONASE) 50 MCG/ACT nasal spray Place 1 spray into both nostrils daily.   Yes [provider]  fluticasone furoate-vilanterol (BREO ELLIPTA) 200-25 MCG/INH AEPB Inhale 1 puff into the lungs daily.   Yes [provider]  gabapentin (NEURONTIN) 300 MG capsule Take 300 mg by mouth 3 (three) times  daily.   Yes [provider]  ibuprofen (ADVIL,MOTRIN) 800 MG tablet Take 800 mg by mouth every 6 (six) hours as needed for moderate pain.   Yes [provider]  isosorbide mononitrate (IMDUR) 30 MG 24 hr tablet Take 30 mg by mouth daily.   Yes [provider]  levothyroxine (SYNTHROID, LEVOTHROID) 150 MCG tablet Take 150 mcg by mouth daily before breakfast.   Yes [provider]  magnesium oxide (MAG-OX) 400 MG tablet Take 400-800 mg by mouth 2 (two) times daily. Patient taking 2 tablets (821m) in the morning and one tablet at night   Yes [provider]  metFORMIN (GLUCOPHAGE) 1000 MG tablet Take 500 mg by mouth 2 (two) times daily with a meal. Takes 5046m One-half tablet twice daily   Yes [provider]  montelukast (SINGULAIR) 10 MG tablet Take 10 mg by mouth at bedtime.   Yes [provider]  nitroGLYCERIN (NITROSTAT) 0.4 MG SL tablet Place 0.4 mg under the tongue every 5 (five) minutes as needed for chest pain.   Yes [provider]  nystatin cream (MYCOSTATIN) Apply 1 application topically 2 (two) times daily.   Yes [provider]  OXYGEN Inhale 2 L into the lungs at bedtime.   Yes [provider]  pantoprazole (PROTONIX) 40 MG tablet Take 40 mg by mouth daily.   Yes [provider]  potassium chloride (K-DUR) 10 MEQ tablet Take 10 mEq by mouth daily.   Yes [provider]  prazosin (MINIPRESS) 1 MG capsule Take 1 mg by mouth at bedtime.   Yes [provider]  simvastatin (ZOCOR) 20 MG tablet Take 20 mg by mouth daily at 6 PM.   Yes [provider]  sucralfate (CARAFATE) 1 g tablet Take 1 g by mouth 4 (four) times daily -  with meals and at bedtime.   Yes [provider]  topiramate (TOPAMAX) 25 MG tablet Take 25 mg by mouth at bedtime.   Yes [provider]  Valproic Acid 250 MG CPDR Take 750-1,000 mg by mouth 2 (two) times daily. Takes four  capsules (1000 mg) in the morning and 3 capsules (75071mat bedtime.   Yes [provider]  Venlafaxine HCl 150 MG TB24 Take 150 mg by mouth daily.   Yes [provider]  vitamin B-12 (CYANOCOBALAMIN) 1000 MCG tablet Take 1,000 mcg by mouth daily.   Yes [provider]  Vitamin D, Ergocalciferol, (DRISDOL) 50000 units CAPS capsule Take 50,000 Units by mouth every 7 (seven) days.   Yes [provider]    Family History No family history on file.  Social History Social History   Tobacco Use  . Smoking status: Never Smoker  . Smokeless tobacco: Never Used  Substance Use Topics  . Alcohol use: No  Frequency: Never  . Drug use: No     Allergies   Amoxicillin; Azithromycin; Corn-containing products; Pea extract; Penicillins; and Sulfa antibiotics   Review of Systems Review of Systems All other systems reviewed and are negative except that which was mentioned in HPI  Physical Exam Updated Vital Signs BP 112/62   Pulse (!) 57   Temp (!) 97.5 F (36.4 C) (Oral)   Resp 12   Ht '5\' 4"'  (1.626 m)   Wt 95.3 kg (210 lb)   SpO2 94%   BMI 36.05 kg/m   Physical Exam  Constitutional: She is oriented to person, place, and time. She appears well-developed and well-nourished. No distress.  HENT:  Head: Normocephalic and atraumatic.  Moist mucous membranes  Eyes: Conjunctivae are normal. Pupils are equal, round, and reactive to light.  Neck: Neck supple.  Cardiovascular: Normal rate, regular rhythm and normal heart sounds.  No murmur heard. Pulmonary/Chest: Effort normal and breath sounds normal.  Abdominal: Soft. Bowel sounds are normal. She exhibits no distension. There is no tenderness.  Genitourinary:  Genitourinary Comments: Foley catheter in place  Musculoskeletal: She exhibits no edema.  Neurological: She is alert and oriented to person, place, and time.  Fluent speech, no facial asymmetry; endorses decreased sensation R face and R arm  compared to L; decreased pinprick sensation to BLE although variable exam with some demonstration of intact touch sensation to feet; 1+ b/l patellar and achilles DTRs, ~3 beats clonus b/l feet; 2/5 strength BLE  Skin: Skin is warm and dry.  Psychiatric:  Flat affect, slowed response time  Nursing note and vitals reviewed.    ED Treatments / Results  Labs (all labs ordered are listed, but only abnormal results are displayed) Labs Reviewed  BASIC METABOLIC PANEL - Abnormal; Notable for the following components:      Result Value   BUN 21 (*)    Creatinine, Ser 1.14 (*)    Calcium 8.7 (*)    GFR calc non Af Amer 51 (*)    GFR calc Af Amer 59 (*)    All other components within normal limits  CBC - Abnormal; Notable for the following components:   WBC 3.2 (*)    RBC 3.31 (*)    Hemoglobin 10.9 (*)    HCT 31.5 (*)    Platelets 105 (*)    All other components within normal limits  TYPE AND SCREEN  ABO/RH    EKG  EKG Interpretation None       Radiology Mr Thoracic Spine Wo Contrast  Result Date: 05/14/2017 CLINICAL DATA:  Initial evaluation for acute loss of feeling to bilateral lower extremities with fall last night. EXAM: MRI THORACIC AND LUMBAR SPINE WITHOUT CONTRAST TECHNIQUE: Multiplanar and multiecho pulse sequences of the thoracic and lumbar spine were obtained without intravenous contrast. COMPARISON:  None. FINDINGS: MRI THORACIC SPINE FINDINGS Alignment: Vertebral bodies normally aligned with preservation of the normal thoracic kyphosis. No listhesis. Vertebrae: Vertebral body heights are maintained without evidence for acute or chronic fracture. Bone marrow signal intensity within normal limits. Mild reactive endplate changes noted about the T9-10 interspace anteriorly. Subcentimeter benign hemangioma noted within the T12 vertebral body. No other discrete or worrisome osseous lesions. Cord: Signal intensity within the thoracic spinal cord is normal. Conus medullaris  terminates below the T12 level. Paraspinal and other soft tissues: Paraspinous soft tissues demonstrate no acute abnormality. Patchy opacity noted within the posterior right upper lobe, which may reflect atelectasis or infiltrate. Disc levels: No significant  disc pathology seen within the thoracic spine. Mild bilateral facet hypertrophy seen from the T7-8 through T11-12 levels. No significant canal stenosis. Mild bilateral foraminal narrowing at T8-9. MRI LUMBAR SPINE FINDINGS Segmentation: Normal segmentation. Lowest well-formed disc labeled the L5-S1 level. Alignment: Trace retrolisthesis of L2 on L3. Vertebral bodies otherwise normally aligned with preservation of the normal lumbar lordosis. Vertebrae: Vertebral body heights are well maintained without evidence for acute or chronic fracture. Bone marrow signal intensity within normal limits. Mild reactive endplate changes noted about the L2-3 interspace anteriorly. No discrete or worrisome osseous lesions. No abnormal marrow edema. Conus medullaris and cauda equina: Conus extends to the L1 level. Conus and cauda equina appear normal. Paraspinal and other soft tissues: Paraspinous soft tissues within normal limits. Visualized visceral structures unremarkable. Disc levels: L1-2:  Unremarkable. L2-3: Trace retrolisthesis. Mild diffuse disc bulge with disc desiccation and intervertebral disc space narrowing. Mild facet and ligament flavum hypertrophy. No significant canal or foraminal stenosis. L3-4: Mild diffuse disc bulge, slightly eccentric to the left. Mild bilateral facet and ligament flavum hypertrophy. Mild left lateral recess narrowing without significant canal stenosis. No significant foraminal encroachment. L4-5: Minimal disc bulge. Mild to moderate facet and ligamentum flavum hypertrophy. Trace joint effusion present on the right. No significant canal stenosis. Mild right L4 foraminal narrowing. No significant left foraminal encroachment. L5-S1: Mild  diffuse disc bulge with disc desiccation. Associated chronic reactive endplate changes with marginal endplate osteophytic spurring. Mild facet and ligament flavum hypertrophy. No significant canal or lateral recess stenosis. Mild bilateral L5 foraminal narrowing. IMPRESSION: MR THORACIC SPINE IMPRESSION 1. No acute abnormality within the thoracic spine. No evidence for cord compression. 2. Mild bilateral facet hypertrophy at T7-8 through T11-12 without significant stenosis. MR LUMBAR SPINE IMPRESSION 1. No acute abnormality within the lumbar spine. No evidence for cord compression. 2. Mild degenerative disc disease for age without significant canal stenosis. 3. Mild right L4 and bilateral L5 foraminal stenosis related to disc bulge and facet disease. Electronically Signed   By: Jeannine Boga M.D.   On: 05/14/2017 14:52   Mr Lumbar Spine Wo Contrast  Result Date: 05/14/2017 CLINICAL DATA:  Initial evaluation for acute loss of feeling to bilateral lower extremities with fall last night. EXAM: MRI THORACIC AND LUMBAR SPINE WITHOUT CONTRAST TECHNIQUE: Multiplanar and multiecho pulse sequences of the thoracic and lumbar spine were obtained without intravenous contrast. COMPARISON:  None. FINDINGS: MRI THORACIC SPINE FINDINGS Alignment: Vertebral bodies normally aligned with preservation of the normal thoracic kyphosis. No listhesis. Vertebrae: Vertebral body heights are maintained without evidence for acute or chronic fracture. Bone marrow signal intensity within normal limits. Mild reactive endplate changes noted about the T9-10 interspace anteriorly. Subcentimeter benign hemangioma noted within the T12 vertebral body. No other discrete or worrisome osseous lesions. Cord: Signal intensity within the thoracic spinal cord is normal. Conus medullaris terminates below the T12 level. Paraspinal and other soft tissues: Paraspinous soft tissues demonstrate no acute abnormality. Patchy opacity noted within the  posterior right upper lobe, which may reflect atelectasis or infiltrate. Disc levels: No significant disc pathology seen within the thoracic spine. Mild bilateral facet hypertrophy seen from the T7-8 through T11-12 levels. No significant canal stenosis. Mild bilateral foraminal narrowing at T8-9. MRI LUMBAR SPINE FINDINGS Segmentation: Normal segmentation. Lowest well-formed disc labeled the L5-S1 level. Alignment: Trace retrolisthesis of L2 on L3. Vertebral bodies otherwise normally aligned with preservation of the normal lumbar lordosis. Vertebrae: Vertebral body heights are well maintained without evidence for acute or chronic fracture.  Bone marrow signal intensity within normal limits. Mild reactive endplate changes noted about the L2-3 interspace anteriorly. No discrete or worrisome osseous lesions. No abnormal marrow edema. Conus medullaris and cauda equina: Conus extends to the L1 level. Conus and cauda equina appear normal. Paraspinal and other soft tissues: Paraspinous soft tissues within normal limits. Visualized visceral structures unremarkable. Disc levels: L1-2:  Unremarkable. L2-3: Trace retrolisthesis. Mild diffuse disc bulge with disc desiccation and intervertebral disc space narrowing. Mild facet and ligament flavum hypertrophy. No significant canal or foraminal stenosis. L3-4: Mild diffuse disc bulge, slightly eccentric to the left. Mild bilateral facet and ligament flavum hypertrophy. Mild left lateral recess narrowing without significant canal stenosis. No significant foraminal encroachment. L4-5: Minimal disc bulge. Mild to moderate facet and ligamentum flavum hypertrophy. Trace joint effusion present on the right. No significant canal stenosis. Mild right L4 foraminal narrowing. No significant left foraminal encroachment. L5-S1: Mild diffuse disc bulge with disc desiccation. Associated chronic reactive endplate changes with marginal endplate osteophytic spurring. Mild facet and ligament flavum  hypertrophy. No significant canal or lateral recess stenosis. Mild bilateral L5 foraminal narrowing. IMPRESSION: MR THORACIC SPINE IMPRESSION 1. No acute abnormality within the thoracic spine. No evidence for cord compression. 2. Mild bilateral facet hypertrophy at T7-8 through T11-12 without significant stenosis. MR LUMBAR SPINE IMPRESSION 1. No acute abnormality within the lumbar spine. No evidence for cord compression. 2. Mild degenerative disc disease for age without significant canal stenosis. 3. Mild right L4 and bilateral L5 foraminal stenosis related to disc bulge and facet disease. Electronically Signed   By: Jeannine Boga M.D.   On: 05/14/2017 14:52    Procedures Procedures (including critical care time)  Medications Ordered in ED Medications  LORazepam (ATIVAN) injection 0.5 mg (0.5 mg Intravenous Given 05/14/17 0922)  LORazepam (ATIVAN) injection 0.5 mg (0.5 mg Intravenous Given 05/14/17 1251)     Initial Impression / Assessment and Plan / ED Course  I have reviewed the triage vital signs and the nursing notes.  Pertinent labs & imaging results that were available during my care of the patient were reviewed by me and considered in my medical decision making (see chart for details).     PT sent from OSH for spine MRI due to concerns for cauda equina. Awake, alert, NAD on exam. Not complaining of back pain. VSS.  She does demonstrate some leg weakness and some decreased sensation in her legs although her exam was variable and somewhat inconsistent.  OSH labs show normal ESR/CRP, Normal BNP and trop, CMP notable for Na 133, calcium 8.4, Cr 1.22, albumin 2.9; CBC notable for WBC 4, Hgb 11.3, Plt 129; INR 1.1. UA negative for signs of infection or blood.  OSH imaging: CT head negative acute. CT lumbar spine w/ severe degenerative changes.   Obtained MRI of thoracic and lumbar spine which showed no acute abnormalities or evidence of acute cord compression, mild degenerative  changes and tramadol stenosis.  Because of the patient's abnormal neurologic exam distally, contacted neurology for evaluation.  Her disposition is pending neurology team recommendations. Final Clinical Impressions(s) / ED Diagnoses   Final diagnoses:  None    ED Discharge Orders    None       Little, Wenda Overland, MD 05/14/17 1650

## 2017-05-14 NOTE — ED Notes (Signed)
Patient transported to MRI 

## 2017-05-14 NOTE — ED Triage Notes (Signed)
Pt arrives from [place] reporting loss of feeling to BLE with fall last night. Pt reports hx urinary incontinence, denies loss of bowel control. Pt has foley cath in place on arrival. Pt unable to state LSW, [facility]. No drift noted in BUE, drift noted in BLE. Pt AOx4, NAD noted at this time.

## 2017-05-14 NOTE — ED Notes (Signed)
ED Provider at bedside. 

## 2017-05-15 ENCOUNTER — Other Ambulatory Visit: Payer: Self-pay

## 2017-05-15 DIAGNOSIS — R29898 Other symptoms and signs involving the musculoskeletal system: Secondary | ICD-10-CM | POA: Diagnosis not present

## 2017-05-15 DIAGNOSIS — R262 Difficulty in walking, not elsewhere classified: Secondary | ICD-10-CM | POA: Diagnosis not present

## 2017-05-15 DIAGNOSIS — F329 Major depressive disorder, single episode, unspecified: Secondary | ICD-10-CM | POA: Diagnosis not present

## 2017-05-15 DIAGNOSIS — N182 Chronic kidney disease, stage 2 (mild): Secondary | ICD-10-CM | POA: Diagnosis not present

## 2017-05-15 DIAGNOSIS — I129 Hypertensive chronic kidney disease with stage 1 through stage 4 chronic kidney disease, or unspecified chronic kidney disease: Secondary | ICD-10-CM | POA: Diagnosis not present

## 2017-05-15 DIAGNOSIS — G8929 Other chronic pain: Secondary | ICD-10-CM

## 2017-05-15 DIAGNOSIS — J90 Pleural effusion, not elsewhere classified: Secondary | ICD-10-CM | POA: Insufficient documentation

## 2017-05-15 DIAGNOSIS — J449 Chronic obstructive pulmonary disease, unspecified: Secondary | ICD-10-CM | POA: Diagnosis not present

## 2017-05-15 DIAGNOSIS — E119 Type 2 diabetes mellitus without complications: Secondary | ICD-10-CM | POA: Diagnosis not present

## 2017-05-15 DIAGNOSIS — R531 Weakness: Secondary | ICD-10-CM | POA: Diagnosis not present

## 2017-05-15 LAB — BASIC METABOLIC PANEL
ANION GAP: 8 (ref 5–15)
BUN: 18 mg/dL (ref 6–20)
CALCIUM: 8.7 mg/dL — AB (ref 8.9–10.3)
CO2: 27 mmol/L (ref 22–32)
Chloride: 101 mmol/L (ref 101–111)
Creatinine, Ser: 0.97 mg/dL (ref 0.44–1.00)
GLUCOSE: 94 mg/dL (ref 65–99)
Potassium: 4 mmol/L (ref 3.5–5.1)
SODIUM: 136 mmol/L (ref 135–145)

## 2017-05-15 LAB — URINALYSIS, ROUTINE W REFLEX MICROSCOPIC
Bilirubin Urine: NEGATIVE
GLUCOSE, UA: NEGATIVE mg/dL
KETONES UR: 5 mg/dL — AB
NITRITE: NEGATIVE
PH: 8 (ref 5.0–8.0)
Protein, ur: NEGATIVE mg/dL
Specific Gravity, Urine: 1.016 (ref 1.005–1.030)
Squamous Epithelial / LPF: NONE SEEN

## 2017-05-15 LAB — CBC WITH DIFFERENTIAL/PLATELET
BASOS ABS: 0 10*3/uL (ref 0.0–0.1)
BASOS PCT: 0 %
Eosinophils Absolute: 0 10*3/uL (ref 0.0–0.7)
Eosinophils Relative: 1 %
HEMATOCRIT: 32.3 % — AB (ref 36.0–46.0)
Hemoglobin: 10.9 g/dL — ABNORMAL LOW (ref 12.0–15.0)
Lymphocytes Relative: 42 %
Lymphs Abs: 1.1 10*3/uL (ref 0.7–4.0)
MCH: 31.9 pg (ref 26.0–34.0)
MCHC: 33.7 g/dL (ref 30.0–36.0)
MCV: 94.4 fL (ref 78.0–100.0)
MONO ABS: 0.4 10*3/uL (ref 0.1–1.0)
Monocytes Relative: 15 %
NEUTROS ABS: 1.1 10*3/uL — AB (ref 1.7–7.7)
NEUTROS PCT: 42 %
Platelets: 112 10*3/uL — ABNORMAL LOW (ref 150–400)
RBC: 3.42 MIL/uL — ABNORMAL LOW (ref 3.87–5.11)
RDW: 12.8 % (ref 11.5–15.5)
WBC: 2.6 10*3/uL — AB (ref 4.0–10.5)

## 2017-05-15 LAB — IRON AND TIBC
IRON: 44 ug/dL (ref 28–170)
Saturation Ratios: 17 % (ref 10.4–31.8)
TIBC: 252 ug/dL (ref 250–450)
UIBC: 208 ug/dL

## 2017-05-15 LAB — HIV ANTIBODY (ROUTINE TESTING W REFLEX): HIV SCREEN 4TH GENERATION: NONREACTIVE

## 2017-05-15 LAB — VITAMIN B12: Vitamin B-12: 1880 pg/mL — ABNORMAL HIGH (ref 180–914)

## 2017-05-15 LAB — RETICULOCYTES
RBC.: 3.42 MIL/uL — ABNORMAL LOW (ref 3.87–5.11)
RETIC COUNT ABSOLUTE: 61.6 10*3/uL (ref 19.0–186.0)
Retic Ct Pct: 1.8 % (ref 0.4–3.1)

## 2017-05-15 LAB — TSH: TSH: 3.374 u[IU]/mL (ref 0.350–4.500)

## 2017-05-15 LAB — TROPONIN I: Troponin I: <0.03 ng/mL (ref <0.03)

## 2017-05-15 LAB — FERRITIN: Ferritin: 147 ng/mL (ref 11–307)

## 2017-05-15 LAB — GLUCOSE, CAPILLARY: Glucose-Capillary: 92 mg/dL (ref 65–99)

## 2017-05-15 LAB — FOLATE: FOLATE: 8.6 ng/mL (ref >5.9)

## 2017-05-15 MED ORDER — NITROGLYCERIN 0.4 MG SL SUBL
0.4000 mg | SUBLINGUAL_TABLET | Freq: Once | SUBLINGUAL | Status: AC
  Administered 2017-05-15: 0.4 mg via SUBLINGUAL
  Filled 2017-05-15: qty 1

## 2017-05-15 MED ORDER — ZOLPIDEM TARTRATE 5 MG PO TABS
5.0000 mg | ORAL_TABLET | Freq: Once | ORAL | Status: DC
  Filled 2017-05-15: qty 1

## 2017-05-15 MED ORDER — GI COCKTAIL ~~LOC~~
30.0000 mL | Freq: Once | ORAL | Status: AC
  Administered 2017-05-15: 30 mL via ORAL
  Filled 2017-05-15: qty 30

## 2017-05-15 NOTE — Progress Notes (Addendum)
PROGRESS NOTE    Jamie Shea  KGU:[id number] DOB: 04-09-1956 DOA: 05/14/2017 PCP: System, Pcp Not In   Outpatient Specialists:     Brief Narrative:   Jamie Shea is a 62 y.o. female with medical history significant for COPD with chronic hypoxic respiratory failure, seizure disorder, hypertension, depression, anxiety, and chronic pain, now presenting to the emergency to an outside emergency department for evaluation of bilateral lower extremity numbness and weakness.  Patient has reported that she has been experiencing increased pain in both of the lower extremities over the past few months and has fallen a few times secondary to this.  She reports getting up to use the bathroom earlier today, but was unable to walk due to bilateral leg numbness and weakness.  She denies any headache, change in vision or hearing, upper extremity numbness or weakness, or acute low back pain.  No recent fevers or chills.  She was noted to have urinary retention at the outside hospital, Foley catheter was placed, and there was concern for cauda equina, and so the patient was transferred here for MRI.     Assessment & Plan:   Principal Problem:   Leg weakness, bilateral Active Problems:   COPD (chronic obstructive pulmonary disease) (HCC)   Chronic pain   Depression   Diabetes mellitus without complication (HCC)   Hypertension   Seizures (HCC)   Pancytopenia (HCC)   Bilateral leg weakness   Thyroid disease   CKD (chronic kidney disease), stage II   Pleural effusion  Bilateral leg weakness - Presents with numbness and weakness involving bilateral LE's, sent from outside ED for MRI lumbar spine  - Normal ESR and CRP and non-acute head CT at outside hospital - MRI lumbar and thoracic spine negative for acute abnormality or cord-compression  - Neurology consulted and suspicious for functional problem PT/OT- SNF  COPD, chronic hypoxic respiratory failure  - Stable on admission with no dyspnea or  wheezing  - Continue ICS/LABA, supplemental O2, and prn albuterol    Depression, anxiety  - Continue Effexor, Klonopin    Seizure disorder  - Continue Topamax and Depakote    Hypothyroidism  - Continue Synthroid   Pancytopenia  - WBC is 3,200 on admission with Hgb 10.9 and platelets 105,000  - She has had intermittent leukopenia and normocytic anemia going back many years, and has documented hx of thrombocytopenia  - No evidence for infection or bleeding on admission    CKD stage II  - SCr is 1.14 on admission, consistent with her apparent baseline  - Renally-dose medications, avoid nephrotoxins   Pleural effusions -patient not sure of cause        Code Status: Full Code   Family Communication:   Disposition Plan:  SNf when bed available   Consultants:  neuro  Subjective: Asking to go home  Objective: Vitals:   05/15/17 0405 05/15/17 0644 05/15/17 0932 05/15/17 1525  BP: 123/63 (!) 126/51  122/69  Pulse: 60 (!) 53  (!) 56  Resp: '18 16  20  ' Temp: 98 F (36.7 C) 98 F (36.7 C)  98.3 F (36.8 C)  TempSrc: Oral Oral  Oral  SpO2: 96% 94% 96% 100%  Weight: 107.3 kg (236 lb 8.9 oz)     Height:        Intake/Output Summary (Last 24 hours) at 05/15/2017 1609 Last data filed at 05/15/2017 1400 Gross per 24 hour  Intake -  Output 800 ml  Net -800 ml   Filed  Weights   05/14/17 0807 05/15/17 0001 05/15/17 0405  Weight: 95.3 kg (210 lb) 106.4 kg (234 lb 9.1 oz) 107.3 kg (236 lb 8.9 oz)    Examination:  General exam: chronically ill appearing Central nervous system: alert, inconsistent exam Extremities: inconsistent exam Psychiatry: flat affect    Data Reviewed: I have personally reviewed following labs and imaging studies  CBC: Recent Labs  Lab 05/14/17 0950 05/15/17 0421  WBC 3.2* 2.6*  NEUTROABS  --  1.1*  HGB 10.9* 10.9*  HCT 31.5* 32.3*  MCV 95.2 94.4  PLT 105* 161*   Basic Metabolic Panel: Recent Labs  Lab 05/14/17 0950  05/15/17 0421  NA 136 136  K 4.0 4.0  CL 101 101  CO2 26 27  GLUCOSE 82 94  BUN 21* 18  CREATININE 1.14* 0.97  CALCIUM 8.7* 8.7*   GFR: Estimated Creatinine Clearance: 71.1 mL/min (by C-G formula based on SCr of 0.97 mg/dL). Liver Function Tests: No results for input(s): AST, ALT, ALKPHOS, BILITOT, PROT, ALBUMIN in the last 168 hours. No results for input(s): LIPASE, AMYLASE in the last 168 hours. No results for input(s): AMMONIA in the last 168 hours. Coagulation Profile: No results for input(s): INR, PROTIME in the last 168 hours. Cardiac Enzymes: No results for input(s): CKTOTAL, CKMB, CKMBINDEX, TROPONINI in the last 168 hours. BNP (last 3 results) No results for input(s): PROBNP in the last 8760 hours. HbA1C: No results for input(s): HGBA1C in the last 72 hours. CBG: Recent Labs  Lab 05/15/17 0845  GLUCAP 92   Lipid Profile: No results for input(s): CHOL, HDL, LDLCALC, TRIG, CHOLHDL, LDLDIRECT in the last 72 hours. Thyroid Function Tests: Recent Labs    05/15/17 0421  TSH 3.374   Anemia Panel: Recent Labs    05/15/17 0421  VITAMINB12 1,880*  FOLATE 8.6  FERRITIN 147  TIBC 252  IRON 44  RETICCTPCT 1.8   Urine analysis:    Component Value Date/Time   COLORURINE YELLOW 05/15/2017 0742   APPEARANCEUR CLEAR 05/15/2017 0742   LABSPEC 1.016 05/15/2017 0742   PHURINE 8.0 05/15/2017 0742   GLUCOSEU NEGATIVE 05/15/2017 0742   HGBUR MODERATE (A) 05/15/2017 0742   BILIRUBINUR NEGATIVE 05/15/2017 0742   KETONESUR 5 (A) 05/15/2017 0742   PROTEINUR NEGATIVE 05/15/2017 0742   NITRITE NEGATIVE 05/15/2017 0742   LEUKOCYTESUR TRACE (A) 05/15/2017 0742     )No results found for this or any previous visit (from the past 240 hour(s)).    Anti-infectives (From admission, onward)   None       Radiology Studies: Mr Thoracic Spine Wo Contrast  Result Date: 05/14/2017 CLINICAL DATA:  Initial evaluation for acute loss of feeling to bilateral lower extremities  with fall last night. EXAM: MRI THORACIC AND LUMBAR SPINE WITHOUT CONTRAST TECHNIQUE: Multiplanar and multiecho pulse sequences of the thoracic and lumbar spine were obtained without intravenous contrast. COMPARISON:  None. FINDINGS: MRI THORACIC SPINE FINDINGS Alignment: Vertebral bodies normally aligned with preservation of the normal thoracic kyphosis. No listhesis. Vertebrae: Vertebral body heights are maintained without evidence for acute or chronic fracture. Bone marrow signal intensity within normal limits. Mild reactive endplate changes noted about the T9-10 interspace anteriorly. Subcentimeter benign hemangioma noted within the T12 vertebral body. No other discrete or worrisome osseous lesions. Cord: Signal intensity within the thoracic spinal cord is normal. Conus medullaris terminates below the T12 level. Paraspinal and other soft tissues: Paraspinous soft tissues demonstrate no acute abnormality. Patchy opacity noted within the posterior right upper lobe, which  may reflect atelectasis or infiltrate. Disc levels: No significant disc pathology seen within the thoracic spine. Mild bilateral facet hypertrophy seen from the T7-8 through T11-12 levels. No significant canal stenosis. Mild bilateral foraminal narrowing at T8-9. MRI LUMBAR SPINE FINDINGS Segmentation: Normal segmentation. Lowest well-formed disc labeled the L5-S1 level. Alignment: Trace retrolisthesis of L2 on L3. Vertebral bodies otherwise normally aligned with preservation of the normal lumbar lordosis. Vertebrae: Vertebral body heights are well maintained without evidence for acute or chronic fracture. Bone marrow signal intensity within normal limits. Mild reactive endplate changes noted about the L2-3 interspace anteriorly. No discrete or worrisome osseous lesions. No abnormal marrow edema. Conus medullaris and cauda equina: Conus extends to the L1 level. Conus and cauda equina appear normal. Paraspinal and other soft tissues: Paraspinous  soft tissues within normal limits. Visualized visceral structures unremarkable. Disc levels: L1-2:  Unremarkable. L2-3: Trace retrolisthesis. Mild diffuse disc bulge with disc desiccation and intervertebral disc space narrowing. Mild facet and ligament flavum hypertrophy. No significant canal or foraminal stenosis. L3-4: Mild diffuse disc bulge, slightly eccentric to the left. Mild bilateral facet and ligament flavum hypertrophy. Mild left lateral recess narrowing without significant canal stenosis. No significant foraminal encroachment. L4-5: Minimal disc bulge. Mild to moderate facet and ligamentum flavum hypertrophy. Trace joint effusion present on the right. No significant canal stenosis. Mild right L4 foraminal narrowing. No significant left foraminal encroachment. L5-S1: Mild diffuse disc bulge with disc desiccation. Associated chronic reactive endplate changes with marginal endplate osteophytic spurring. Mild facet and ligament flavum hypertrophy. No significant canal or lateral recess stenosis. Mild bilateral L5 foraminal narrowing. IMPRESSION: MR THORACIC SPINE IMPRESSION 1. No acute abnormality within the thoracic spine. No evidence for cord compression. 2. Mild bilateral facet hypertrophy at T7-8 through T11-12 without significant stenosis. MR LUMBAR SPINE IMPRESSION 1. No acute abnormality within the lumbar spine. No evidence for cord compression. 2. Mild degenerative disc disease for age without significant canal stenosis. 3. Mild right L4 and bilateral L5 foraminal stenosis related to disc bulge and facet disease. Electronically Signed   By: Jeannine Boga M.D.   On: 05/14/2017 14:52   Mr Lumbar Spine Wo Contrast  Result Date: 05/14/2017 CLINICAL DATA:  Initial evaluation for acute loss of feeling to bilateral lower extremities with fall last night. EXAM: MRI THORACIC AND LUMBAR SPINE WITHOUT CONTRAST TECHNIQUE: Multiplanar and multiecho pulse sequences of the thoracic and lumbar spine were  obtained without intravenous contrast. COMPARISON:  None. FINDINGS: MRI THORACIC SPINE FINDINGS Alignment: Vertebral bodies normally aligned with preservation of the normal thoracic kyphosis. No listhesis. Vertebrae: Vertebral body heights are maintained without evidence for acute or chronic fracture. Bone marrow signal intensity within normal limits. Mild reactive endplate changes noted about the T9-10 interspace anteriorly. Subcentimeter benign hemangioma noted within the T12 vertebral body. No other discrete or worrisome osseous lesions. Cord: Signal intensity within the thoracic spinal cord is normal. Conus medullaris terminates below the T12 level. Paraspinal and other soft tissues: Paraspinous soft tissues demonstrate no acute abnormality. Patchy opacity noted within the posterior right upper lobe, which may reflect atelectasis or infiltrate. Disc levels: No significant disc pathology seen within the thoracic spine. Mild bilateral facet hypertrophy seen from the T7-8 through T11-12 levels. No significant canal stenosis. Mild bilateral foraminal narrowing at T8-9. MRI LUMBAR SPINE FINDINGS Segmentation: Normal segmentation. Lowest well-formed disc labeled the L5-S1 level. Alignment: Trace retrolisthesis of L2 on L3. Vertebral bodies otherwise normally aligned with preservation of the normal lumbar lordosis. Vertebrae: Vertebral body heights are  well maintained without evidence for acute or chronic fracture. Bone marrow signal intensity within normal limits. Mild reactive endplate changes noted about the L2-3 interspace anteriorly. No discrete or worrisome osseous lesions. No abnormal marrow edema. Conus medullaris and cauda equina: Conus extends to the L1 level. Conus and cauda equina appear normal. Paraspinal and other soft tissues: Paraspinous soft tissues within normal limits. Visualized visceral structures unremarkable. Disc levels: L1-2:  Unremarkable. L2-3: Trace retrolisthesis. Mild diffuse disc bulge  with disc desiccation and intervertebral disc space narrowing. Mild facet and ligament flavum hypertrophy. No significant canal or foraminal stenosis. L3-4: Mild diffuse disc bulge, slightly eccentric to the left. Mild bilateral facet and ligament flavum hypertrophy. Mild left lateral recess narrowing without significant canal stenosis. No significant foraminal encroachment. L4-5: Minimal disc bulge. Mild to moderate facet and ligamentum flavum hypertrophy. Trace joint effusion present on the right. No significant canal stenosis. Mild right L4 foraminal narrowing. No significant left foraminal encroachment. L5-S1: Mild diffuse disc bulge with disc desiccation. Associated chronic reactive endplate changes with marginal endplate osteophytic spurring. Mild facet and ligament flavum hypertrophy. No significant canal or lateral recess stenosis. Mild bilateral L5 foraminal narrowing. IMPRESSION: MR THORACIC SPINE IMPRESSION 1. No acute abnormality within the thoracic spine. No evidence for cord compression. 2. Mild bilateral facet hypertrophy at T7-8 through T11-12 without significant stenosis. MR LUMBAR SPINE IMPRESSION 1. No acute abnormality within the lumbar spine. No evidence for cord compression. 2. Mild degenerative disc disease for age without significant canal stenosis. 3. Mild right L4 and bilateral L5 foraminal stenosis related to disc bulge and facet disease. Electronically Signed   By: Jeannine Boga M.D.   On: 05/14/2017 14:52        Scheduled Meds: . ARIPiprazole  2 mg Oral Daily  . aspirin EC  81 mg Oral Daily  . atenolol  25 mg Oral Daily  . bumetanide  1 mg Oral QODAY  . clonazePAM  0.5 mg Oral BID  . dicyclomine  20 mg Oral TID AC  . divalproex  1,000 mg Oral Daily  . divalproex  750 mg Oral QHS  . fluticasone  1 spray Each Nare Daily  . fluticasone furoate-vilanterol  1 puff Inhalation Daily  . gabapentin  300 mg Oral TID  . isosorbide mononitrate  30 mg Oral Daily  .  levothyroxine  150 mcg Oral QAC breakfast  . magnesium oxide  800 mg Oral Daily   And  . magnesium oxide  400 mg Oral QHS  . montelukast  10 mg Oral QHS  . pantoprazole  40 mg Oral Daily  . potassium chloride  10 mEq Oral Daily  . prazosin  1 mg Oral QHS  . simvastatin  20 mg Oral q1800  . sodium chloride flush  3 mL Intravenous Q12H  . sodium chloride flush  3 mL Intravenous Q12H  . sucralfate  1 g Oral TID WC & HS  . topiramate  25 mg Oral QHS  . venlafaxine XR  150 mg Oral Daily  . vitamin B-12  1,000 mcg Oral Daily   Continuous Infusions: . sodium chloride       LOS: 0 days    Time spent: 35 min    Geradine Girt, DO [facility] Pager [phone]  If 7PM-7AM, please contact night-coverage [email or web address] Password [email or web address] 05/15/2017, 4:09 PM

## 2017-05-15 NOTE — Progress Notes (Signed)
Pt arrived floor. Assessed to be AxOx3. Cardiac telemetry initiated, vital signs and skin assessment completed. Pt oriented to the room. Will continue to monitor.

## 2017-05-15 NOTE — Progress Notes (Signed)
Follow up assessment reveals findings suggestive of but not definitively diagnostic of a functional etiology. These include total absence of volitional movement of legs except for big-toe wiggling on the left, but with more pronounced movement of toes as well as ankle dorsiflexion bilaterally in response to noxious plantar stimulation. Additionally, patellar and achilles reflexes are 2+ and symmetric. There is no clonus on forced dorsiflexion of ankles. Tone is flaccid when attempting to passively lift and flex lower extremities, but transiently increases with noxious stimuli. At rest, toes and feet are in a normal position, without lateral rotation of the legs or foot drop which would be expected for flaccid paresis.   Electronically signed: Dr. Caryl PinaEric Baxter Gonzalez

## 2017-05-15 NOTE — Progress Notes (Addendum)
Subjective: Patient states that she feels a little better today, a little stronger in her legs.  Exam: Vitals:   05/15/17 0405 05/15/17 0644  BP: 123/63 (!) 126/51  Pulse: 60 (!) 53  Resp: 18 16  Temp: 98 F (36.7 C) 98 F (36.7 C)  SpO2: 96% 94%    Physical Exam   HEENT-  Normocephalic, no lesions, without obvious abnormality.  Normal external eye and conjunctiva.   Cardiovascular- S1-S2 audible, pulses palpable throughout   Lungs-no rhonchi or wheezing noted, no excessive working breathing.  Saturations within normal limits Abdomen- All 4 quadrants palpated and nontender Extremities- Warm, dry and intact Musculoskeletal-no joint tenderness, deformity or swelling Skin-warm and dry, no hyperpigmentation, vitiligo, or suspicious lesions    Neuro:  Mental Status: Alert, oriented, thought content appropriate.  Speech fluent without evidence of aphasia.  Able to follow 3 step commands without difficulty. Cranial Nerves: II:  Visual fields grossly normal,  III,IV, VI: ptosis not present, extra-ocular motions intact bilaterally pupils equal, round, reactive to light and accommodation V,VII: smile symmetric, facial light touch sensation normal bilaterally VIII: hearing normal bilaterally IX,X: uvula rises symmetrically XI: bilateral shoulder shrug XII: midline tongue extension Motor: Patient is moving bilateral upper extremities with 5/5 strength.  Patient shows 4/5 strength with dorsiflexion and plantar flexion with slight give way, able to wiggle her toes, abduction/abduction of bilateral legs is 5 out of 5, patient states that she is unable to raise her legs with them extended however when not paying attention she did lift her left leg about an inch off the bed but when paying attention she had a positive Hoover sign. Sensory: Now withdrawing slightly to noxious stimuli in lower extremities and upper extremities Deep Tendon Reflexes: 2+ and symmetric throughout Plantars: Right:  downgoing   Left: downgoing Cerebellar: normal finger-to-nose, Gait: Not tested    Medications:  Scheduled: . ARIPiprazole  2 mg Oral Daily  . aspirin EC  81 mg Oral Daily  . atenolol  25 mg Oral Daily  . bumetanide  1 mg Oral QODAY  . clonazePAM  0.5 mg Oral BID  . dicyclomine  20 mg Oral TID AC  . divalproex  1,000 mg Oral Daily  . divalproex  750 mg Oral QHS  . fluticasone  1 spray Each Nare Daily  . fluticasone furoate-vilanterol  1 puff Inhalation Daily  . gabapentin  300 mg Oral TID  . isosorbide mononitrate  30 mg Oral Daily  . levothyroxine  150 mcg Oral QAC breakfast  . magnesium oxide  800 mg Oral Daily   And  . magnesium oxide  400 mg Oral QHS  . montelukast  10 mg Oral QHS  . pantoprazole  40 mg Oral Daily  . potassium chloride  10 mEq Oral Daily  . prazosin  1 mg Oral QHS  . simvastatin  20 mg Oral q1800  . sodium chloride flush  3 mL Intravenous Q12H  . sodium chloride flush  3 mL Intravenous Q12H  . sucralfate  1 g Oral TID WC & HS  . topiramate  25 mg Oral QHS  . venlafaxine XR  150 mg Oral Daily  . vitamin B-12  1,000 mcg Oral Daily    Pertinent Labs/Diagnostics: TSH 3.374 B12 1880 Folate 8.6   Mr Thoracic Spine Wo Contrast  Result Date: 05/14/2017 CLINICAL DATA:  Initial evaluation for acute loss of feeling to bilateral lower extremities with fall last night. EXAM: MRI THORACIC AND LUMBAR SPINE WITHOUT CONTRAST TECHNIQUE: Multiplanar  and multiecho pulse sequences of the thoracic and lumbar spine were obtained without intravenous contrast. COMPARISON:  None. FINDINGS: MRI THORACIC SPINE FINDINGS Alignment: Vertebral bodies normally aligned with preservation of the normal thoracic kyphosis. No listhesis. Vertebrae: Vertebral body heights are maintained without evidence for acute or chronic fracture. Bone marrow signal intensity within normal limits. Mild reactive endplate changes noted about the T9-10 interspace anteriorly. Subcentimeter benign hemangioma  noted within the T12 vertebral body. No other discrete or worrisome osseous lesions. Cord: Signal intensity within the thoracic spinal cord is normal. Conus medullaris terminates below the T12 level. Paraspinal and other soft tissues: Paraspinous soft tissues demonstrate no acute abnormality. Patchy opacity noted within the posterior right upper lobe, which may reflect atelectasis or infiltrate. Disc levels: No significant disc pathology seen within the thoracic spine. Mild bilateral facet hypertrophy seen from the T7-8 through T11-12 levels. No significant canal stenosis. Mild bilateral foraminal narrowing at T8-9. MRI LUMBAR SPINE FINDINGS Segmentation: Normal segmentation. Lowest well-formed disc labeled the L5-S1 level. Alignment: Trace retrolisthesis of L2 on L3. Vertebral bodies otherwise normally aligned with preservation of the normal lumbar lordosis. Vertebrae: Vertebral body heights are well maintained without evidence for acute or chronic fracture. Bone marrow signal intensity within normal limits. Mild reactive endplate changes noted about the L2-3 interspace anteriorly. No discrete or worrisome osseous lesions. No abnormal marrow edema. Conus medullaris and cauda equina: Conus extends to the L1 level. Conus and cauda equina appear normal. Paraspinal and other soft tissues: Paraspinous soft tissues within normal limits. Visualized visceral structures unremarkable. Disc levels: L1-2:  Unremarkable. L2-3: Trace retrolisthesis. Mild diffuse disc bulge with disc desiccation and intervertebral disc space narrowing. Mild facet and ligament flavum hypertrophy. No significant canal or foraminal stenosis. L3-4: Mild diffuse disc bulge, slightly eccentric to the left. Mild bilateral facet and ligament flavum hypertrophy. Mild left lateral recess narrowing without significant canal stenosis. No significant foraminal encroachment. L4-5: Minimal disc bulge. Mild to moderate facet and ligamentum flavum hypertrophy.  Trace joint effusion present on the right. No significant canal stenosis. Mild right L4 foraminal narrowing. No significant left foraminal encroachment. L5-S1: Mild diffuse disc bulge with disc desiccation. Associated chronic reactive endplate changes with marginal endplate osteophytic spurring. Mild facet and ligament flavum hypertrophy. No significant canal or lateral recess stenosis. Mild bilateral L5 foraminal narrowing. IMPRESSION: MR THORACIC SPINE IMPRESSION 1. No acute abnormality within the thoracic spine. No evidence for cord compression. 2. Mild bilateral facet hypertrophy at T7-8 through T11-12 without significant stenosis. MR LUMBAR SPINE IMPRESSION 1. No acute abnormality within the lumbar spine. No evidence for cord compression. 2. Mild degenerative disc disease for age without significant canal stenosis. 3. Mild right L4 and bilateral L5 foraminal stenosis related to disc bulge and facet disease. Electronically Signed   By: Rise Mu M.D.   On: 05/14/2017 14:52   Mr Lumbar Spine Wo Contrast  Result Date: 05/14/2017 CLINICAL DATA:  Initial evaluation for acute loss of feeling to bilateral lower extremities with fall last night. EXAM: MRI THORACIC AND LUMBAR SPINE WITHOUT CONTRAST TECHNIQUE: Multiplanar and multiecho pulse sequences of the thoracic and lumbar spine were obtained without intravenous contrast. COMPARISON:  None. FINDINGS: MRI THORACIC SPINE FINDINGS Alignment: Vertebral bodies normally aligned with preservation of the normal thoracic kyphosis. No listhesis. Vertebrae: Vertebral body heights are maintained without evidence for acute or chronic fracture. Bone marrow signal intensity within normal limits. Mild reactive endplate changes noted about the T9-10 interspace anteriorly. Subcentimeter benign hemangioma noted within the T12 vertebral  body. No other discrete or worrisome osseous lesions. Cord: Signal intensity within the thoracic spinal cord is normal. Conus medullaris  terminates below the T12 level. Paraspinal and other soft tissues: Paraspinous soft tissues demonstrate no acute abnormality. Patchy opacity noted within the posterior right upper lobe, which may reflect atelectasis or infiltrate. Disc levels: No significant disc pathology seen within the thoracic spine. Mild bilateral facet hypertrophy seen from the T7-8 through T11-12 levels. No significant canal stenosis. Mild bilateral foraminal narrowing at T8-9. MRI LUMBAR SPINE FINDINGS Segmentation: Normal segmentation. Lowest well-formed disc labeled the L5-S1 level. Alignment: Trace retrolisthesis of L2 on L3. Vertebral bodies otherwise normally aligned with preservation of the normal lumbar lordosis. Vertebrae: Vertebral body heights are well maintained without evidence for acute or chronic fracture. Bone marrow signal intensity within normal limits. Mild reactive endplate changes noted about the L2-3 interspace anteriorly. No discrete or worrisome osseous lesions. No abnormal marrow edema. Conus medullaris and cauda equina: Conus extends to the L1 level. Conus and cauda equina appear normal. Paraspinal and other soft tissues: Paraspinous soft tissues within normal limits. Visualized visceral structures unremarkable. Disc levels: L1-2:  Unremarkable. L2-3: Trace retrolisthesis. Mild diffuse disc bulge with disc desiccation and intervertebral disc space narrowing. Mild facet and ligament flavum hypertrophy. No significant canal or foraminal stenosis. L3-4: Mild diffuse disc bulge, slightly eccentric to the left. Mild bilateral facet and ligament flavum hypertrophy. Mild left lateral recess narrowing without significant canal stenosis. No significant foraminal encroachment. L4-5: Minimal disc bulge. Mild to moderate facet and ligamentum flavum hypertrophy. Trace joint effusion present on the right. No significant canal stenosis. Mild right L4 foraminal narrowing. No significant left foraminal encroachment. L5-S1: Mild  diffuse disc bulge with disc desiccation. Associated chronic reactive endplate changes with marginal endplate osteophytic spurring. Mild facet and ligament flavum hypertrophy. No significant canal or lateral recess stenosis. Mild bilateral L5 foraminal narrowing. IMPRESSION: MR THORACIC SPINE IMPRESSION 1. No acute abnormality within the thoracic spine. No evidence for cord compression. 2. Mild bilateral facet hypertrophy at T7-8 through T11-12 without significant stenosis. MR LUMBAR SPINE IMPRESSION 1. No acute abnormality within the lumbar spine. No evidence for cord compression. 2. Mild degenerative disc disease for age without significant canal stenosis. 3. Mild right L4 and bilateral L5 foraminal stenosis related to disc bulge and facet disease. Electronically Signed   By: Rise MuBenjamin  McClintock M.D.   On: 05/14/2017 14:52     Felicie MornDavid Smith PA-C [facility] [phone]  Impression:  Acute paraplegia - improving today with no specific treatment. Do suspect reduced effort on exam.   Recommendations: Continue PT/OT   NEUROHOSPITALIST ADDENDUM Seen and examined the patient today.  She has improved exam today and able to lift her legs antigravity for 1-2 seconds, no longer complains of dense sensory loss as yesterday. She has 4/5 strength in plantar and dorsi flexors. As noted her MRI T and L Spine are negative to explain weakness. While conversion disorder is diagnosis of exclusion, I feel given her presentation of acute bilateral leg weakness with no other deficits in upper extremities, normal reflexes in lower extremities and  in the presence of unremarkable MRI T and L spine is reassuring and likely her weakness is due to poor effort.   She will need PT/OT eval, if weakness persists > 1 week she will need to be revaluated with LP, EMG/NCS.    Georgiana SpinnerSushanth Aroor MD [facility] [phone]  If 7pm to 7am, please call on call as listed on AMION.   05/15/2017,  9:24  AM

## 2017-05-15 NOTE — Evaluation (Signed)
Physical Therapy Evaluation Patient Details Name: Jamie Shea MRN: [id number] DOB: November 30, 1955 Today's Date: 05/15/2017   History of Present Illness  63yo female presented with B LE numbness and weakness, also pain in B LEs and falls over the past few months, as well as recent inability to walk. PMH OA, asthma, chronic pani, COPD, depression, DM, HTN, migranes, hx seizures, thyroid disease, hx heart cath   Clinical Impression   Patient received in bed, pleasant and willing to participate with skilled PT services; no family is available to determine PLOF/prior functional and cognitive status, and patient appears pleasantly confused although she is able to state her name and why she is in the hospital. She requires ModA for supine to sit/sit to supine transfers as well as cues for safety with functional bed mobility; due to poor B LE strength at this time, attempted lateral/scoot transfer however unable to safely complete with assist of just +1 as patient fatigues quite quickly and is limited in how much she is able to assist in sliding/pushing with her UEs during transfer. Briefly spoke to patient's daughter over the phone regarding PT recommendations per patient request, daughter voices understanding to all education provided today. Patient left in bed with all needs met, bed alarm activated this afternoon. From PT perspective, she does not appear appropriate for return home at this point and will likely benefit from skilled rehabilitation in SNF setting moving forward.     Follow Up Recommendations SNF    Equipment Recommendations  None recommended by PT    Recommendations for Other Services       Precautions / Restrictions Precautions Precautions: Fall Restrictions Weight Bearing Restrictions: No      Mobility  Bed Mobility Overal bed mobility: Needs Assistance Bed Mobility: Rolling;Supine to Sit;Sit to Supine Rolling: Min guard   Supine to sit: Mod assist Sit to supine: Mod  assist   General bed mobility comments: cues for safety, ModA for safety   Transfers Overall transfer level: Needs assistance Equipment used: 1 person hand held assist Transfers: Lateral/Scoot Transfers          Lateral/Scoot Transfers: Max assist General transfer comment: attempted lateral/scoot transfer however unable to safely perform with assist of just one person; recommend +2 for future attempts at transfers. Patient very fatigued and unable to push much with UEs during transfer.   Ambulation/Gait             General Gait Details: DNT/unable   Stairs            Wheelchair Mobility    Modified Rankin (Stroke Patients Only)       Balance Overall balance assessment: History of Falls;Needs assistance Sitting-balance support: Bilateral upper extremity supported;Feet supported Sitting balance-Leahy Scale: Good                                       Pertinent Vitals/Pain Pain Assessment: No/denies pain    Home Living Family/patient expects to be discharged to:: Private residence Living Arrangements: Children Available Help at Discharge: Family             Additional Comments: patient is a very poor historian and PT unable to obtain accurate details regarding home setup/equipment and assistance available other than that patient lives with daughter     Prior Function Level of Independence: Independent with assistive device(s)         Comments: used a Radiation protection practitioner  at baseline before LE weakness started, has some form of home nursing/aide but unsure of details as patient is poor historian      Hand Dominance        Extremity/Trunk Assessment        Lower Extremity Assessment Lower Extremity Assessment: Generalized weakness;RLE deficits/detail;LLE deficits/detail RLE Deficits / Details: patient able to wiggle toes/slightly move ankle but unable to raise or abduct R LE in bed; only trace hip extensor activity noted  RLE Sensation:  decreased light touch;decreased proprioception;history of peripheral neuropathy RLE Coordination: decreased gross motor;decreased fine motor LLE Deficits / Details: able to wiggle toes/slightly move ankle but unable to raise or abduct L LE in bed; only trace hip extensor activity noted  LLE Sensation: decreased light touch;decreased proprioception;history of peripheral neuropathy LLE Coordination: decreased gross motor;decreased fine motor    Cervical / Trunk Assessment Cervical / Trunk Assessment: Kyphotic  Communication   Communication: No difficulties  Cognition Arousal/Alertness: Awake/alert Behavior During Therapy: Flat affect;WFL for tasks assessed/performed Overall Cognitive Status: No family/caregiver present to determine baseline cognitive functioning                                 General Comments: patient appears pleasantly confused, but family not available to determine full PLOF       General Comments      Exercises     Assessment/Plan    PT Assessment Patient needs continued PT services  PT Problem List Decreased strength;Decreased mobility;Decreased safety awareness;Decreased coordination;Decreased knowledge of precautions;Obesity;Decreased activity tolerance;Decreased balance;Decreased knowledge of use of DME;Impaired sensation       PT Treatment Interventions DME instruction;Therapeutic activities;Gait training;Therapeutic exercise;Patient/family education;Stair training;Balance training;Functional mobility training;Neuromuscular re-education    PT Goals (Current goals can be found in the Care Plan section)  Acute Rehab PT Goals Patient Stated Goal: to go home  PT Goal Formulation: With patient Time For Goal Achievement: 05/29/17 Potential to Achieve Goals: Fair    Frequency Min 2X/week   Barriers to discharge        Co-evaluation               AM-PAC PT "6 Clicks" Daily Activity  Outcome Measure Difficulty turning over in bed  (including adjusting bedclothes, sheets and blankets)?: Unable Difficulty moving from lying on back to sitting on the side of the bed? : Unable Difficulty sitting down on and standing up from a chair with arms (e.g., wheelchair, bedside commode, etc,.)?: Unable Help needed moving to and from a bed to chair (including a wheelchair)?: A Lot Help needed walking in hospital room?: Total Help needed climbing 3-5 steps with a railing? : Total 6 Click Score: 7    End of Session Equipment Utilized During Treatment: Gait belt Activity Tolerance: Patient tolerated treatment well;Patient limited by fatigue Patient left: in bed;with bed alarm set;with call bell/phone within reach   PT Visit Diagnosis: Muscle weakness (generalized) (M62.81);Other abnormalities of gait and mobility (R26.89);History of falling (Z91.81);Other symptoms and signs involving the nervous system (R29.898)    Time: 5885-0277 PT Time Calculation (min) (ACUTE ONLY): 46 min   Charges:   PT Evaluation $PT Eval Moderate Complexity: 1 Mod PT Treatments $Therapeutic Activity: 8-22 mins $Self Care/Home Management: 8-22   PT G Codes:        Deniece Ree PT, DPT, CBIS  Supplemental Physical Therapist [place]   Pager [phone]

## 2017-05-16 DIAGNOSIS — D61818 Other pancytopenia: Secondary | ICD-10-CM | POA: Diagnosis not present

## 2017-05-16 DIAGNOSIS — E119 Type 2 diabetes mellitus without complications: Secondary | ICD-10-CM | POA: Diagnosis not present

## 2017-05-16 DIAGNOSIS — R29898 Other symptoms and signs involving the musculoskeletal system: Secondary | ICD-10-CM | POA: Diagnosis not present

## 2017-05-16 DIAGNOSIS — N182 Chronic kidney disease, stage 2 (mild): Secondary | ICD-10-CM | POA: Diagnosis not present

## 2017-05-16 LAB — GLUCOSE, CAPILLARY: GLUCOSE-CAPILLARY: 105 mg/dL — AB (ref 65–99)

## 2017-05-16 LAB — TROPONIN I: Troponin I: <0.03 ng/mL (ref <0.03)

## 2017-05-16 NOTE — Progress Notes (Signed)
CM spoke with Byrd HesselbachMaria, pt's daughter, @ [phone] to discuss transitional care needs. Maria informed CM the plan @ d/c is for mom to transition to SNF/rehab, per PT's recommendations. CM told Hilda LiasMarie  CSW  would be made aware and will f/u with her. CM made referral to CSW for SNF placement. Gae GallopAngela Ardith Test RN,BSN,CM

## 2017-05-16 NOTE — Progress Notes (Addendum)
PROGRESS NOTE    Jamie Shea  MCN:[id number] DOB: [birth year] DOA: 05/14/2017 PCP: System, Pcp Not In   Outpatient Specialists:     Brief Narrative:   Jamie Shea is a 62 y.o. female with medical history significant for COPD with chronic hypoxic respiratory failure, seizure disorder, hypertension, depression, anxiety, and chronic pain, now presenting to the emergency to an outside emergency department for evaluation of bilateral lower extremity numbness and weakness.  Patient has reported that she has been experiencing increased pain in both of the lower extremities over the past few months and has fallen a few times secondary to this.  She reports getting up to use the bathroom earlier today, but was unable to walk due to bilateral leg numbness and weakness.  She denies any headache, change in vision or hearing, upper extremity numbness or weakness, or acute low back pain.  No recent fevers or chills.  She was noted to have urinary retention at the outside hospital, Foley catheter was placed, and there was concern for cauda equina, and so the patient was transferred here for MRI.     Assessment & Plan:   Principal Problem:   Leg weakness, bilateral Active Problems:   COPD (chronic obstructive pulmonary disease) (HCC)   Chronic pain   Depression   Diabetes mellitus without complication (HCC)   Hypertension   Seizures (HCC)   Pancytopenia (HCC)   Bilateral leg weakness   Thyroid disease   CKD (chronic kidney disease), stage II   Pleural effusion  Bilateral leg weakness - Presents with numbness and weakness involving bilateral LE's, sent from outside ED for MRI lumbar spine  - Normal ESR and CRP and non-acute head CT at outside hospital - MRI lumbar and thoracic spine negative for acute abnormality or cord-compression  - Neurology consulted and suspicious for functional problem PT/OT- SNF-- patient refused SNF and I spoke with daughter who provides 24 hour care at home--  later in the day, daughter changed her mind and wants SNF-- social work consult  COPD, chronic hypoxic respiratory failure  - Stable on admission with no dyspnea or wheezing  - Continue ICS/LABA, supplemental O2, and prn albuterol    Depression, anxiety  - Continue Effexor, Klonopin    Chest pain -resolved -CE negative -tele unremarkable  Seizure disorder  - Continue Topamax and Depakote    Hypothyroidism  - Continue Synthroid   Pancytopenia  - She has had intermittent leukopenia and normocytic anemia going back many years, and has documented hx of thrombocytopenia  - No evidence for infection or bleeding on admission  -outpatient follow up   CKD stage II  - SCr is 1.14 on admission, consistent with her apparent baseline  - Renally-dose medications, avoid nephrotoxins   Pleural effusions -patient not sure of cause -follows at [facility]       Code Status: Full Code   Family Communication: Daughter on phone  Disposition Plan:  SNf when bed available   Consultants:  neuro  Subjective: No further chest pain Says she misses her dog   Objective: Vitals:   05/15/17 1954 05/15/17 2118 05/16/17 0516 05/16/17 0900  BP: 96/86 (!) 176/83 133/78   Pulse:  (!) 47 61   Resp:  18 18   Temp:  98 F (36.7 C) 97.6 F (36.4 C)   TempSrc:      SpO2:  100% 98% 98%  Weight:      Height:        Intake/Output Summary (Last 24  hours) at 05/16/2017 1258 Last data filed at 05/16/2017 1000 Gross per 24 hour  Intake 240 ml  Output 50 ml  Net 190 ml   Filed Weights   05/14/17 0807 05/15/17 0001 05/15/17 0405  Weight: 95.3 kg (210 lb) 106.4 kg (234 lb 9.1 oz) 107.3 kg (236 lb 8.9 oz)    Examination:  General exam: up in chair Central nervous system: alert and cooperative Extremities: moves all 4 ext     Data Reviewed: I have personally reviewed following labs and imaging studies  CBC: Recent Labs  Lab 05/14/17 0950 05/15/17 0421  WBC 3.2*  2.6*  NEUTROABS  --  1.1*  HGB 10.9* 10.9*  HCT 31.5* 32.3*  MCV 95.2 94.4  PLT 105* 751*   Basic Metabolic Panel: Recent Labs  Lab 05/14/17 0950 05/15/17 0421  NA 136 136  K 4.0 4.0  CL 101 101  CO2 26 27  GLUCOSE 82 94  BUN 21* 18  CREATININE 1.14* 0.97  CALCIUM 8.7* 8.7*   GFR: Estimated Creatinine Clearance: 71.1 mL/min (by C-G formula based on SCr of 0.97 mg/dL). Liver Function Tests: No results for input(s): AST, ALT, ALKPHOS, BILITOT, PROT, ALBUMIN in the last 168 hours. No results for input(s): LIPASE, AMYLASE in the last 168 hours. No results for input(s): AMMONIA in the last 168 hours. Coagulation Profile: No results for input(s): INR, PROTIME in the last 168 hours. Cardiac Enzymes: Recent Labs  Lab 05/15/17 2124 05/16/17 0411  TROPONINI <0.03 <0.03   BNP (last 3 results) No results for input(s): PROBNP in the last 8760 hours. HbA1C: No results for input(s): HGBA1C in the last 72 hours. CBG: Recent Labs  Lab 05/15/17 0845 05/16/17 0809  GLUCAP 92 105*   Lipid Profile: No results for input(s): CHOL, HDL, LDLCALC, TRIG, CHOLHDL, LDLDIRECT in the last 72 hours. Thyroid Function Tests: Recent Labs    05/15/17 0421  TSH 3.374   Anemia Panel: Recent Labs    05/15/17 0421  VITAMINB12 1,880*  FOLATE 8.6  FERRITIN 147  TIBC 252  IRON 44  RETICCTPCT 1.8   Urine analysis:    Component Value Date/Time   COLORURINE YELLOW 05/15/2017 0742   APPEARANCEUR CLEAR 05/15/2017 0742   LABSPEC 1.016 05/15/2017 0742   PHURINE 8.0 05/15/2017 0742   GLUCOSEU NEGATIVE 05/15/2017 0742   HGBUR MODERATE (A) 05/15/2017 0742   BILIRUBINUR NEGATIVE 05/15/2017 0742   KETONESUR 5 (A) 05/15/2017 0742   PROTEINUR NEGATIVE 05/15/2017 0742   NITRITE NEGATIVE 05/15/2017 0742   LEUKOCYTESUR TRACE (A) 05/15/2017 0742     )No results found for this or any previous visit (from the past 240 hour(s)).    Anti-infectives (From admission, onward)   None        Radiology Studies: Mr Thoracic Spine Wo Contrast  Result Date: 05/14/2017 CLINICAL DATA:  Initial evaluation for acute loss of feeling to bilateral lower extremities with fall last night. EXAM: MRI THORACIC AND LUMBAR SPINE WITHOUT CONTRAST TECHNIQUE: Multiplanar and multiecho pulse sequences of the thoracic and lumbar spine were obtained without intravenous contrast. COMPARISON:  None. FINDINGS: MRI THORACIC SPINE FINDINGS Alignment: Vertebral bodies normally aligned with preservation of the normal thoracic kyphosis. No listhesis. Vertebrae: Vertebral body heights are maintained without evidence for acute or chronic fracture. Bone marrow signal intensity within normal limits. Mild reactive endplate changes noted about the T9-10 interspace anteriorly. Subcentimeter benign hemangioma noted within the T12 vertebral body. No other discrete or worrisome osseous lesions. Cord: Signal intensity within the thoracic  spinal cord is normal. Conus medullaris terminates below the T12 level. Paraspinal and other soft tissues: Paraspinous soft tissues demonstrate no acute abnormality. Patchy opacity noted within the posterior right upper lobe, which may reflect atelectasis or infiltrate. Disc levels: No significant disc pathology seen within the thoracic spine. Mild bilateral facet hypertrophy seen from the T7-8 through T11-12 levels. No significant canal stenosis. Mild bilateral foraminal narrowing at T8-9. MRI LUMBAR SPINE FINDINGS Segmentation: Normal segmentation. Lowest well-formed disc labeled the L5-S1 level. Alignment: Trace retrolisthesis of L2 on L3. Vertebral bodies otherwise normally aligned with preservation of the normal lumbar lordosis. Vertebrae: Vertebral body heights are well maintained without evidence for acute or chronic fracture. Bone marrow signal intensity within normal limits. Mild reactive endplate changes noted about the L2-3 interspace anteriorly. No discrete or worrisome osseous lesions. No  abnormal marrow edema. Conus medullaris and cauda equina: Conus extends to the L1 level. Conus and cauda equina appear normal. Paraspinal and other soft tissues: Paraspinous soft tissues within normal limits. Visualized visceral structures unremarkable. Disc levels: L1-2:  Unremarkable. L2-3: Trace retrolisthesis. Mild diffuse disc bulge with disc desiccation and intervertebral disc space narrowing. Mild facet and ligament flavum hypertrophy. No significant canal or foraminal stenosis. L3-4: Mild diffuse disc bulge, slightly eccentric to the left. Mild bilateral facet and ligament flavum hypertrophy. Mild left lateral recess narrowing without significant canal stenosis. No significant foraminal encroachment. L4-5: Minimal disc bulge. Mild to moderate facet and ligamentum flavum hypertrophy. Trace joint effusion present on the right. No significant canal stenosis. Mild right L4 foraminal narrowing. No significant left foraminal encroachment. L5-S1: Mild diffuse disc bulge with disc desiccation. Associated chronic reactive endplate changes with marginal endplate osteophytic spurring. Mild facet and ligament flavum hypertrophy. No significant canal or lateral recess stenosis. Mild bilateral L5 foraminal narrowing. IMPRESSION: MR THORACIC SPINE IMPRESSION 1. No acute abnormality within the thoracic spine. No evidence for cord compression. 2. Mild bilateral facet hypertrophy at T7-8 through T11-12 without significant stenosis. MR LUMBAR SPINE IMPRESSION 1. No acute abnormality within the lumbar spine. No evidence for cord compression. 2. Mild degenerative disc disease for age without significant canal stenosis. 3. Mild right L4 and bilateral L5 foraminal stenosis related to disc bulge and facet disease. Electronically Signed   By: Jeannine Boga M.D.   On: 05/14/2017 14:52   Mr Lumbar Spine Wo Contrast  Result Date: 05/14/2017 CLINICAL DATA:  Initial evaluation for acute loss of feeling to bilateral lower  extremities with fall last night. EXAM: MRI THORACIC AND LUMBAR SPINE WITHOUT CONTRAST TECHNIQUE: Multiplanar and multiecho pulse sequences of the thoracic and lumbar spine were obtained without intravenous contrast. COMPARISON:  None. FINDINGS: MRI THORACIC SPINE FINDINGS Alignment: Vertebral bodies normally aligned with preservation of the normal thoracic kyphosis. No listhesis. Vertebrae: Vertebral body heights are maintained without evidence for acute or chronic fracture. Bone marrow signal intensity within normal limits. Mild reactive endplate changes noted about the T9-10 interspace anteriorly. Subcentimeter benign hemangioma noted within the T12 vertebral body. No other discrete or worrisome osseous lesions. Cord: Signal intensity within the thoracic spinal cord is normal. Conus medullaris terminates below the T12 level. Paraspinal and other soft tissues: Paraspinous soft tissues demonstrate no acute abnormality. Patchy opacity noted within the posterior right upper lobe, which may reflect atelectasis or infiltrate. Disc levels: No significant disc pathology seen within the thoracic spine. Mild bilateral facet hypertrophy seen from the T7-8 through T11-12 levels. No significant canal stenosis. Mild bilateral foraminal narrowing at T8-9. MRI LUMBAR SPINE FINDINGS Segmentation:  Normal segmentation. Lowest well-formed disc labeled the L5-S1 level. Alignment: Trace retrolisthesis of L2 on L3. Vertebral bodies otherwise normally aligned with preservation of the normal lumbar lordosis. Vertebrae: Vertebral body heights are well maintained without evidence for acute or chronic fracture. Bone marrow signal intensity within normal limits. Mild reactive endplate changes noted about the L2-3 interspace anteriorly. No discrete or worrisome osseous lesions. No abnormal marrow edema. Conus medullaris and cauda equina: Conus extends to the L1 level. Conus and cauda equina appear normal. Paraspinal and other soft tissues:  Paraspinous soft tissues within normal limits. Visualized visceral structures unremarkable. Disc levels: L1-2:  Unremarkable. L2-3: Trace retrolisthesis. Mild diffuse disc bulge with disc desiccation and intervertebral disc space narrowing. Mild facet and ligament flavum hypertrophy. No significant canal or foraminal stenosis. L3-4: Mild diffuse disc bulge, slightly eccentric to the left. Mild bilateral facet and ligament flavum hypertrophy. Mild left lateral recess narrowing without significant canal stenosis. No significant foraminal encroachment. L4-5: Minimal disc bulge. Mild to moderate facet and ligamentum flavum hypertrophy. Trace joint effusion present on the right. No significant canal stenosis. Mild right L4 foraminal narrowing. No significant left foraminal encroachment. L5-S1: Mild diffuse disc bulge with disc desiccation. Associated chronic reactive endplate changes with marginal endplate osteophytic spurring. Mild facet and ligament flavum hypertrophy. No significant canal or lateral recess stenosis. Mild bilateral L5 foraminal narrowing. IMPRESSION: MR THORACIC SPINE IMPRESSION 1. No acute abnormality within the thoracic spine. No evidence for cord compression. 2. Mild bilateral facet hypertrophy at T7-8 through T11-12 without significant stenosis. MR LUMBAR SPINE IMPRESSION 1. No acute abnormality within the lumbar spine. No evidence for cord compression. 2. Mild degenerative disc disease for age without significant canal stenosis. 3. Mild right L4 and bilateral L5 foraminal stenosis related to disc bulge and facet disease. Electronically Signed   By: Jeannine Boga M.D.   On: 05/14/2017 14:52        Scheduled Meds: . ARIPiprazole  2 mg Oral Daily  . aspirin EC  81 mg Oral Daily  . atenolol  25 mg Oral Daily  . bumetanide  1 mg Oral QODAY  . clonazePAM  0.5 mg Oral BID  . dicyclomine  20 mg Oral TID AC  . divalproex  1,000 mg Oral Daily  . divalproex  750 mg Oral QHS  .  fluticasone  1 spray Each Nare Daily  . fluticasone furoate-vilanterol  1 puff Inhalation Daily  . gabapentin  300 mg Oral TID  . isosorbide mononitrate  30 mg Oral Daily  . levothyroxine  150 mcg Oral QAC breakfast  . magnesium oxide  800 mg Oral Daily   And  . magnesium oxide  400 mg Oral QHS  . montelukast  10 mg Oral QHS  . pantoprazole  40 mg Oral Daily  . potassium chloride  10 mEq Oral Daily  . prazosin  1 mg Oral QHS  . simvastatin  20 mg Oral q1800  . sodium chloride flush  3 mL Intravenous Q12H  . sodium chloride flush  3 mL Intravenous Q12H  . sucralfate  1 g Oral TID WC & HS  . topiramate  25 mg Oral QHS  . venlafaxine XR  150 mg Oral Daily  . vitamin B-12  1,000 mcg Oral Daily  . zolpidem  5 mg Oral Once   Continuous Infusions: . sodium chloride       LOS: 0 days    Time spent: 15 min    Geradine Girt, DO [facility] Pager  [phone]  If 7PM-7AM, please contact night-coverage [email or web address] Password [facility] 05/16/2017, 12:58 PM

## 2017-05-16 NOTE — Progress Notes (Signed)
Patient complained of chest pain 8/10, described as pressure to mid chest radiating to left chest. Vitals obtained and EKG taken showing Sinus brady and ST abnormality but did not show in epic. Notified team. Obtained second EKG, continues to have chest pain. Given gi cocktail no changes noted to pain. Right after administering medications, patient closed eyes and was not responding to verbal cues, care nurse did sternum rub and patient coughed and became alert again. unresponsiveness lasted less than a minute, but patient did not have changes in breathing or loss of pulse. NP at bedside for assessment and notified of changes. Patient states she has done this in past and doctors did not diagnosis it.   Patient had a second episode of unresponsiveness while the NP was in room and after sternum rub and calling her name she became alert again. Patient given nitroglycerin and was effective for chest pain.

## 2017-05-16 NOTE — Plan of Care (Signed)
  Progressing Pain Managment: General experience of comfort will improve 05/16/2017 0521 - Progressing by Burtis Junesrewery, Jalin Erpelding, RN Chest pain resolved will continue to monitor. Safety: Ability to remain free from injury will improve 05/16/2017 0521 - Progressing by Burtis Junesrewery, Quadasia Newsham, RN Patient assisted to recliner encouraged to use call bell prior to transfers.

## 2017-05-16 NOTE — Progress Notes (Signed)
Physical Therapy Treatment Patient Details Name: Jamie Shea MRN: [id number] DOB: [birth year] Today's Date: 05/16/2017    History of Present Illness 62yo female presented with B LE numbness and weakness, also pain in B LEs and falls over the past few months, as well as recent inability to walk. PMH OA, asthma, chronic pani, COPD, depression, DM, HTN, migranes, hx seizures, thyroid disease, hx heart cath     PT Comments    Patient received in bed, pleasant and willing to participate in skilled PT session this afternoon. She continues to require ModA for bed mobility, as well as general min guard for safety at edge of bed, however was able to come to full stand today with RW and MinA as well as perform stand-pivot transfer to chair with RW and min guard, verbal encouragement provided throughout. Noted significant B LE weakness and shaking during transfer but patient able to complete safely. She was left up in the chair with all needs met, chair alarm activated this afternoon, and will continue to benefit from short term rehab/SNF moving forward.     Follow Up Recommendations  SNF     Equipment Recommendations  None recommended by PT    Recommendations for Other Services       Precautions / Restrictions Precautions Precautions: Fall Restrictions Weight Bearing Restrictions: No    Mobility  Bed Mobility Overal bed mobility: Needs Assistance Bed Mobility: Rolling;Supine to Sit Rolling: Supervision   Supine to sit: Mod assist Sit to supine: Mod assist   General bed mobility comments: cues for safety, ModA to power up trunk and straighten out at edge of bed   Transfers Overall transfer level: Needs assistance Equipment used: Rolling walker (2 wheeled) Transfers: Sit to/from [facility] Sit to Stand: Min assist Stand pivot transfers: Min guard       General transfer comment: MinA for light boost; Min guard during stand pivot transfer, Mod verbal  encouragement and cues for safety   Ambulation/Gait             General Gait Details: DNT, able to take 2-3 steps during stand pivot transfer but LEs very shakey, fatigued    Stairs            Wheelchair Mobility    Modified Rankin (Stroke Patients Only)       Balance Overall balance assessment: History of Falls;Needs assistance Sitting-balance support: Bilateral upper extremity supported;Feet supported Sitting balance-Leahy Scale: Good     Standing balance support: Bilateral upper extremity supported;During functional activity Standing balance-Leahy Scale: Fair Standing balance comment: reliant on UE support                             Cognition Arousal/Alertness: Awake/alert Behavior During Therapy: WFL for tasks assessed/performed Overall Cognitive Status: Within Functional Limits for tasks assessed                                 General Comments: patient appears pleasantly confused, but family not available to determine full PLOF       Exercises      General Comments        Pertinent Vitals/Pain Pain Assessment: No/denies pain    Home Living Family/patient expects to be discharged to:: Skilled nursing facility Living Arrangements: Children Available Help at Discharge: Family           Additional Comments: patient is  a very poor historian and PT unable to obtain accurate details regarding home setup/equipment and assistance available other than that patient lives with daughter     Prior Function Level of Independence: Independent with assistive device(s)      Comments: used a rollator at baseline before LE weakness started, has some form of home nursing/aide but unsure of details as patient is poor historian    PT Goals (current goals can now be found in the care plan section) Acute Rehab PT Goals Patient Stated Goal: to go home but i know i need therapy PT Goal Formulation: With patient Time For Goal Achievement:  05/29/17 Potential to Achieve Goals: Fair Progress towards PT goals: Progressing toward goals    Frequency    Min 2X/week      PT Plan Current plan remains appropriate    Co-evaluation              AM-PAC PT "6 Clicks" Daily Activity  Outcome Measure  Difficulty turning over in bed (including adjusting bedclothes, sheets and blankets)?: Unable Difficulty moving from lying on back to sitting on the side of the bed? : Unable Difficulty sitting down on and standing up from a chair with arms (e.g., wheelchair, bedside commode, etc,.)?: Unable Help needed moving to and from a bed to chair (including a wheelchair)?: A Little Help needed walking in hospital room?: A Lot Help needed climbing 3-5 steps with a railing? : Total 6 Click Score: 9    End of Session Equipment Utilized During Treatment: Gait belt Activity Tolerance: Patient tolerated treatment well;Patient limited by fatigue Patient left: in chair;with chair alarm set;with call bell/phone within reach   PT Visit Diagnosis: Muscle weakness (generalized) (M62.81);Other abnormalities of gait and mobility (R26.89);History of falling (Z91.81);Other symptoms and signs involving the nervous system (R29.898)     Time: 8469-6295 PT Time Calculation (min) (ACUTE ONLY): 16 min  Charges:  $Therapeutic Activity: 8-22 mins                    G Codes:       Jamie Shea PT, DPT, CBIS  Supplemental Physical Therapist [place]   Pager [phone]

## 2017-05-16 NOTE — Progress Notes (Signed)
Patient is refusing SNF.  No further chest pain-- plan to d/c home with daughter providing 24 hour care.   Marlin CanaryJessica Maude Gloor DO

## 2017-05-16 NOTE — Progress Notes (Signed)
Per MD, patient discharging home. CSW signing off.  Osborne Cascoadia Kessa Fairbairn LCSW [phone]

## 2017-05-16 NOTE — Progress Notes (Signed)
Shift event note:  Notified by RN just before 2100 that pt c/o mid-sternal CP that radiates to (L) chest. All VSS. Pt reports pain 10/10. 12-lead EKG obtained and revealed SB (not new) w/ rate of 49 and inverted T's in leads V1-V6. No ischemic changes. There is no previous EKG on record to compare. Orders placed for GI cocktail and pt assessed at bedside. Upon arrival to bedside pt noted resting quietly in bed. She is alert and answers simple questions appropriately. Unsure of the name of the hospital but is aware she is the hospital and can tell me her B-day and where she lives in [place]. She reports CP is mid-chest and radiates to her (L) neck. She does admit to mild nausea w/ onset of CP but denies SOB, sweating or dizziness. She admits to having these pains at home for which she takes SL NTG though reports her doctor told her it wasn't her heart. She states GI cocktail did not provide relief. Pt reports CP now 8-9/10 though pt appears in NAD. Skin w/d, color normal. BBS CTA, S1/S2 w/ RRR. Pt was given NTG SL x 1 and observed briefly at bedside. At time of my departure pt already reports improvement in CP. Troponin pending. Assessment/Plan: 1. Chest pain: In pt with multiple risk factors including but not limited to HTN, DM, obesity and hyperlipidemia. Unclear CAD hx as pt is a transfer in from [place]. EKG w/o ischemic changes. CP improving w/ SL NTG. First troponin negative. Will continue to trend. Will continue to monitor closely on telemetry.   Leanne ChangKatherine P. Shmiel Morton, NP-C [facility] [phone]  CRITICAL CARE Performed by: Leanne ChangSCHORR, Brantlee Penn P   Total critical care time: 60 minutes  Critical care time was exclusive of separately billable procedures and treating other patients.  Critical care was necessary to treat or prevent imminent or life-threatening deterioration.  Critical care was time spent personally by me on the following activities: development of treatment plan with  patient and/or surrogate as well as nursing, discussions with consultants, evaluation of patient's response to treatment, examination of patient, obtaining history from patient or surrogate, ordering and performing treatments and interventions, ordering and review of laboratory studies, ordering and review of radiographic studies, pulse oximetry and re-evaluation of patient's condition.

## 2017-05-16 NOTE — Evaluation (Signed)
Occupational Therapy Evaluation Patient Details Name: Jamie Shea MRN: [id number] DOB: 11/17/1955 Today's Date: 05/16/2017    History of Present Illness 61yo female presented with B LE numbness and weakness, also pain in B LEs and falls over the past few months, as well as recent inability to walk. PMH OA, asthma, chronic pani, COPD, depression, DM, HTN, migranes, hx seizures, thyroid disease, hx heart cath    Clinical Impression   Pt admitted with weakness. Pt currently with functional limitations due to the deficits listed below (see OT Problem List).  Pt will benefit from skilled OT to increase their safety and independence with ADL and functional mobility for ADL to facilitate discharge to venue listed below.      Follow Up Recommendations  SNF    Equipment Recommendations  None recommended by OT    Recommendations for Other Services       Precautions / Restrictions Precautions Precautions: Fall Restrictions Weight Bearing Restrictions: No      Mobility Bed Mobility Overal bed mobility: Needs Assistance Bed Mobility: Rolling;Sit to Supine;Supine to Sit     Supine to sit: Mod assist Sit to supine: Mod assist   General bed mobility comments: cues for safety, ModA for safety   Transfers                 General transfer comment: did not perform- pt just had gotten back to bed    Balance                                           ADL either performed or assessed with clinical judgement   ADL Overall ADL's : Needs assistance/impaired Eating/Feeding: Set up;Bed level   Grooming: Minimal assistance;Sitting   Upper Body Bathing: Minimal assistance;Sitting   Lower Body Bathing: Maximal assistance;Sit to/from stand;Cueing for safety;Cueing for sequencing   Upper Body Dressing : Minimal assistance;Sitting   Lower Body Dressing: Maximal assistance;Sit to/from stand;Sitting/lateral leans;Cueing for sequencing;Cueing for safety                  General ADL Comments: pt agreeable to SNF as well as MD note states daugther is also     Vision Baseline Vision/History: Wears glasses Patient Visual Report: No change from baseline       Perception     Praxis      Pertinent Vitals/Pain Pain Assessment: No/denies pain     Hand Dominance     Extremity/Trunk Assessment Upper Extremity Assessment Upper Extremity Assessment: Generalized weakness           Communication Communication Communication: No difficulties   Cognition Arousal/Alertness: Awake/alert Behavior During Therapy: WFL for tasks assessed/performed Overall Cognitive Status: Within Functional Limits for tasks assessed                                                Home Living Family/patient expects to be discharged to:: Skilled nursing facility Living Arrangements: Children Available Help at Discharge: Family                             Additional Comments: patient is a very poor historian and PT unable to obtain accurate details regarding home setup/equipment and assistance available other  than that patient lives with daughter       Prior Functioning/Environment Level of Independence: Independent with assistive device(s)        Comments: used a rollator at baseline before LE weakness started, has some form of home nursing/aide but unsure of details as patient is poor historian         OT Problem List: Decreased strength;Decreased activity tolerance;Impaired balance (sitting and/or standing);Decreased safety awareness      OT Treatment/Interventions: Self-care/ADL training;Patient/family education;DME and/or AE instruction    OT Goals(Current goals can be found in the care plan section) Acute Rehab OT Goals Patient Stated Goal: to go home but i know i need therapy OT Goal Formulation: With patient Time For Goal Achievement: 05/23/17 ADL Goals Pt Will Perform Grooming: with set-up;sitting Pt Will  Perform Lower Body Dressing: with min assist;sit to/from stand Pt Will Transfer to Toilet: with min guard assist;bedside commode Pt Will Perform Toileting - Clothing Manipulation and hygiene: with min guard assist;sit to/from stand  OT Frequency: Min 2X/week   Barriers to D/C: Decreased caregiver support             AM-PAC PT "6 Clicks" Daily Activity     Outcome Measure Help from another person eating meals?: A Little Help from another person taking care of personal grooming?: A Little Help from another person toileting, which includes using toliet, bedpan, or urinal?: A Lot Help from another person bathing (including washing, rinsing, drying)?: A Lot Help from another person to put on and taking off regular upper body clothing?: A Little Help from another person to put on and taking off regular lower body clothing?: A Lot 6 Click Score: 15   End of Session Nurse Communication: Mobility status  Activity Tolerance: Patient limited by fatigue Patient left: in bed;with call bell/phone within reach;with bed alarm set  OT Visit Diagnosis: Unsteadiness on feet (R26.81);History of falling (Z91.81);Muscle weakness (generalized) (M62.81)                Time: 1610-9604 OT Time Calculation (min): 20 min Charges:  OT General Charges $OT Visit: 1 Visit OT Evaluation $OT Eval Moderate Complexity: 1 Mod G-Codes:     Lise Auer, OT [phone]  Einar Crow D 05/16/2017, 2:08 PM

## 2017-05-16 NOTE — Progress Notes (Signed)
CSW unable to find facility with a [place] bed. Per CSW AD, patient should discharge home if no longer needing hospital level of care. Will update RNCM.  Osborne Cascoadia Connery Shiffler LCSW [phone]

## 2017-05-16 NOTE — NC FL2 (Signed)
[place] MEDICAID FL2 LEVEL OF CARE SCREENING TOOL     IDENTIFICATION  Patient Name: Jamie Shea Birthdate: 08-05-1955 Sex: female Admission Date (Current Location): 05/14/2017  [facility] and [place]:  [place]   Facility and Address:  [facility], [address], [place], [place] [address]      Provider Number: [id number]  Attending Physician Name and Address:  Joseph Art, DO  Relative Name and Phone Number:  Hilda Lias, daughter, [phone]    Current Level of Care: Hospital Recommended Level of Care: Skilled Nursing Facility Prior Approval Number:    Date Approved/Denied:   PASRR Number:    Discharge Plan: SNF    Current Diagnoses: Patient Active Problem List   Diagnosis Date Noted  . Pleural effusion 05/15/2017  . COPD (chronic obstructive pulmonary disease) (HCC) 05/14/2017  . Chronic pain 05/14/2017  . Depression 05/14/2017  . Diabetes mellitus without complication (HCC) 05/14/2017  . Hypertension 05/14/2017  . Seizures (HCC) 05/14/2017  . Pancytopenia (HCC) 05/14/2017  . Leg weakness, bilateral 05/14/2017  . Bilateral leg weakness 05/14/2017  . Thyroid disease 05/14/2017  . CKD (chronic kidney disease), stage II 05/14/2017  . Bilateral leg numbness     Orientation RESPIRATION BLADDER Height & Weight     Self, Situation, Place  Normal Continent Weight: 107.3 kg (236 lb 8.9 oz) Height:  5\' 2"  (157.5 cm)  BEHAVIORAL SYMPTOMS/MOOD NEUROLOGICAL BOWEL NUTRITION STATUS      Continent Diet(Please see DC Summary)  AMBULATORY STATUS COMMUNICATION OF NEEDS Skin   Limited Assist Verbally Normal                       Personal Care Assistance Level of Assistance  Bathing, Feeding, Dressing Bathing Assistance: Limited assistance Feeding assistance: Independent Dressing Assistance: Limited assistance     Functional Limitations Info             SPECIAL CARE FACTORS FREQUENCY  PT (By licensed PT)     PT  Frequency: 5x/week              Contractures      Additional Factors Info  Code Status, Allergies, Psychotropic Code Status Info: Full Allergies Info: Amoxicillin, Azithromycin, Corn-containing Products, Pea Extract, Penicillins, Sulfa Antibiotics Psychotropic Info: Abilify; Klonipin; Effexor         Current Medications (05/16/2017):  This is the current hospital active medication list Current Facility-Administered Medications  Medication Dose Route Frequency Provider Last Rate Last Dose  . 0.9 %  sodium chloride infusion  250 mL Intravenous PRN Opyd, Lavone Neri, MD      . acetaminophen (TYLENOL) tablet 650 mg  650 mg Oral Q6H PRN Opyd, Lavone Neri, MD       Or  . acetaminophen (TYLENOL) suppository 650 mg  650 mg Rectal Q6H PRN Opyd, Lavone Neri, MD      . albuterol (PROVENTIL) (2.5 MG/3ML) 0.083% nebulizer solution 3 mL  3 mL Inhalation Q6H PRN Opyd, Lavone Neri, MD      . ARIPiprazole (ABILIFY) tablet 2 mg  2 mg Oral Daily Opyd, Lavone Neri, MD   2 mg at 05/16/17 0906  . aspirin EC tablet 81 mg  81 mg Oral Daily Opyd, Lavone Neri, MD   81 mg at 05/16/17 0904  . atenolol (TENORMIN) tablet 25 mg  25 mg Oral Daily Opyd, Lavone Neri, MD   25 mg at 05/16/17 0903  . bumetanide (BUMEX) tablet 1 mg  1 mg Oral  Margaretmary EddyQODAY Opyd, Timothy S, MD   1 mg at 05/15/17 1028  . clonazePAM (KLONOPIN) tablet 0.5 mg  0.5 mg Oral BID Opyd, Lavone Neriimothy S, MD   0.5 mg at 05/16/17 0904  . dicyclomine (BENTYL) tablet 20 mg  20 mg Oral TID AC Opyd, Lavone Neriimothy S, MD   20 mg at 05/16/17 1237  . divalproex (DEPAKOTE) DR tablet 1,000 mg  1,000 mg Oral Daily Opyd, Lavone Neriimothy S, MD   1,000 mg at 05/16/17 0904  . divalproex (DEPAKOTE) DR tablet 750 mg  750 mg Oral QHS Opyd, Lavone Neriimothy S, MD   750 mg at 05/15/17 2118  . fluticasone (FLONASE) 50 MCG/ACT nasal spray 1 spray  1 spray Each Nare Daily Opyd, Lavone Neriimothy S, MD   1 spray at 05/16/17 0905  . fluticasone furoate-vilanterol (BREO ELLIPTA) 200-25 MCG/INH 1 puff  1 puff Inhalation Daily Opyd,  Lavone Neriimothy S, MD   1 puff at 05/16/17 1040  . gabapentin (NEURONTIN) capsule 300 mg  300 mg Oral TID Briscoe Deutscherpyd, Timothy S, MD   300 mg at 05/16/17 0905  . HYDROcodone-acetaminophen (NORCO/VICODIN) 5-325 MG per tablet 1-2 tablet  1-2 tablet Oral Q4H PRN Opyd, Lavone Neriimothy S, MD   2 tablet at 05/16/17 1100  . isosorbide mononitrate (IMDUR) 24 hr tablet 30 mg  30 mg Oral Daily Opyd, Lavone Neriimothy S, MD   30 mg at 05/16/17 0905  . levothyroxine (SYNTHROID, LEVOTHROID) tablet 150 mcg  150 mcg Oral QAC breakfast Opyd, Lavone Neriimothy S, MD   150 mcg at 05/16/17 0902  . magnesium oxide (MAG-OX) tablet 800 mg  800 mg Oral Daily Opyd, Lavone Neriimothy S, MD   800 mg at 05/16/17 [id number]   And  . magnesium oxide (MAG-OX) tablet 400 mg  400 mg Oral QHS Briscoe Deutscherpyd, Timothy S, MD   400 mg at 05/15/17 2117  . montelukast (SINGULAIR) tablet 10 mg  10 mg Oral QHS Opyd, Lavone Neriimothy S, MD   10 mg at 05/15/17 2117  . pantoprazole (PROTONIX) EC tablet 40 mg  40 mg Oral Daily Opyd, Lavone Neriimothy S, MD   40 mg at 05/16/17 0904  . potassium chloride (K-DUR,KLOR-CON) CR tablet 10 mEq  10 mEq Oral Daily Opyd, Lavone Neriimothy S, MD   10 mEq at 05/16/17 0903  . prazosin (MINIPRESS) capsule 1 mg  1 mg Oral QHS Opyd, Lavone Neriimothy S, MD   1 mg at 05/15/17 2118  . promethazine (PHENERGAN) tablet 12.5 mg  12.5 mg Oral Q6H PRN Opyd, Lavone Neriimothy S, MD      . senna-docusate (Senokot-S) tablet 1 tablet  1 tablet Oral QHS PRN Opyd, Lavone Neriimothy S, MD      . simvastatin (ZOCOR) tablet 20 mg  20 mg Oral q1800 Opyd, Lavone Neriimothy S, MD   20 mg at 05/15/17 1818  . sodium chloride flush (NS) 0.9 % injection 3 mL  3 mL Intravenous Q12H Opyd, Lavone Neriimothy S, MD   3 mL at 05/16/17 0906  . sodium chloride flush (NS) 0.9 % injection 3 mL  3 mL Intravenous Q12H Opyd, Lavone Neriimothy S, MD   3 mL at 05/16/17 0906  . sodium chloride flush (NS) 0.9 % injection 3 mL  3 mL Intravenous PRN Opyd, Lavone Neriimothy S, MD      . sucralfate (CARAFATE) tablet 1 g  1 g Oral TID WC & HS Opyd, Lavone Neriimothy S, MD   1 g at 05/16/17 1237  . topiramate (TOPAMAX) tablet 25  mg  25 mg Oral QHS Opyd, Lavone Neriimothy S, MD   25 mg at 05/15/17  2117  . venlafaxine XR (EFFEXOR-XR) 24 hr capsule 150 mg  150 mg Oral Daily Opyd, Lavone Neri, MD   150 mg at 05/16/17 0903  . vitamin B-12 (CYANOCOBALAMIN) tablet 1,000 mcg  1,000 mcg Oral Daily Opyd, Lavone Neri, MD   1,000 mcg at 05/16/17 0903  . zolpidem (AMBIEN) tablet 5 mg  5 mg Oral Once Schorr, Roma Kayser, NP         Discharge Medications: Please see discharge summary for a list of discharge medications.  Relevant Imaging Results:  Relevant Lab Results:   Additional Information SSN: [id number] [place], [place]

## 2017-05-17 DIAGNOSIS — G8929 Other chronic pain: Secondary | ICD-10-CM | POA: Diagnosis not present

## 2017-05-17 DIAGNOSIS — R29898 Other symptoms and signs involving the musculoskeletal system: Secondary | ICD-10-CM | POA: Diagnosis not present

## 2017-05-17 LAB — GLUCOSE, CAPILLARY: GLUCOSE-CAPILLARY: 117 mg/dL — AB (ref 65–99)

## 2017-05-17 NOTE — Progress Notes (Signed)
[facility] @ [phone]. Gae GallopAngela Gina Leblond RN,BSN,CM

## 2017-05-17 NOTE — Progress Notes (Addendum)
CSW spoke with patient's daughter to discuss discharge plan home. She is aware that [facility] would be an up front cost since they live over 50 miles away. She will come pick patient up and requests a wheelchair. RNCM to follow up on home health needs.   CSW signing off.  Cliford Sequeira RayyanOsborne Casco LCSW [phone]

## 2017-05-17 NOTE — Care Management Note (Addendum)
Case Management Note  Patient Details  Name: Jamie PurserJoyce Ann Philbin MRN: [id number] Date of Birth: 05/20/1955  Subjective/Objective:          Presented with LE weakness and numbness. Resides with daughter, Byrd HesselbachMaria, who provides PCS for mom though [employer] (qd/8hrs). Pt has hospital bed and a walker.          Melba CoonMarie Hawkes (Daughter)     [phone]       PCP : Rodena Pietyiffany PLUNK  Action/Plan: Transition to home today with home health services to follow. Daughter will provide transportation to home.  Expected Discharge Date:    05/17/2017            Expected Discharge Plan:  Home w Home Health Services(Resides with daughter , Byrd HesselbachMaria)  In-House Referral:  Clinical Social Work  Discharge planning Services  CM Consult  Post Acute Care Choice:    Choice offered to:  Patient, Adult Children  DME Arranged:   wheelchair,  [place] and [place]. DME Agency:     HH Arranged:  PT, RN HH Agency:  [facility]), pending MD's order. CM has requested.    (o)  [phone]    fax # [phone]  Status of Service:  Completed, signed off  If discussed at Long Length of Stay Meetings, dates discussed:    Additional Comments:  Epifanio LeschesCole, Shenita Trego Hudson, RN 05/17/2017, 10:52 AM

## 2017-05-17 NOTE — Discharge Summary (Signed)
Physician Discharge Summary  Jamie Shea ZLD:[id number] DOB: Jun 13, 1955 DOA: 05/14/2017  PCP: System, Pcp Not In  Admit date: 05/14/2017 Discharge date: 05/17/2017  Time spent: 35 minutes    Discharge Diagnoses:  Principal Problem:   Leg weakness, bilateral/physical deconditioning Active Problems:   COPD (chronic obstructive pulmonary disease) (HCC)   Chronic pain   Depression   Diabetes mellitus without complication (HCC)   Hypertension   Seizures (HCC)   Pancytopenia (HCC)   Thyroid disease   CKD (chronic kidney disease), stage II   Discharge Condition: Stable  Diet recommendation: Healthy heart  Filed Weights   05/14/17 0807 05/15/17 0001 05/15/17 0405  Weight: 95.3 kg (210 lb) 106.4 kg (234 lb 9.1 oz) 107.3 kg (236 lb 8.9 oz)     Hospital Course: A pleasant 62 year old female with multiple chronic medical problems who at her baseline status needs a lot of assistance at home to walk with a walker comes in with weakness.  More so in her legs.  Patient had a neurological evaluation who thought this was due to more deconditioning.  She had an MRI of her lumbar spine which was normal did not show any acute abnormality.  She also had MRI of her thoracic spine.  CT of the head was normal.  She had a normal ESR and CRP.  Neurology recommended physical therapy.  Social work attempted to get patient into rehab center however there was no bed availability near her home.  She opted to go home with her daughter we have arranged optimal care at home including physical therapy home health.  She is being discharged home in stable condition.  I have made no medication changes to her medication regimen.  We have arranged for her to follow with her primary care physician in approximately 1 week.   Discharge Exam: Vitals:   05/17/17 0835 05/17/17 0900  BP:  107/71  Pulse:  (!) 59  Resp:    Temp:  98.2 F (36.8 C)  SpO2: 98% 100%    General: Alert and oriented no apparent  distress Cardiovascular: Regular rate and rhythm without murmurs rubs or gallops Respiratory: Clear to auscultation bilaterally no wheezes rhonchi or rales  Discharge Instructions   Discharge Instructions    Diet - low sodium heart healthy   Complete by:  As directed    Increase activity slowly   Complete by:  As directed      Allergies as of 05/17/2017      Reactions   Amoxicillin Hives   Azithromycin Hives   Corn-containing Products Diarrhea   Pea Extract Diarrhea   Penicillins    Sulfa Antibiotics       Medication List    TAKE these medications   acetaminophen 500 MG tablet Commonly known as:  TYLENOL Take 1,000 mg by mouth every 4 (four) hours as needed for mild pain.   albuterol 108 (90 Base) MCG/ACT inhaler Commonly known as:  PROVENTIL HFA;VENTOLIN HFA Inhale 1-2 puffs into the lungs every 6 (six) hours as needed for wheezing or shortness of breath.   ARIPiprazole 2 MG tablet Commonly known as:  ABILIFY Take 2 mg by mouth daily.   aspirin EC 81 MG tablet Take 81 mg by mouth daily.   atenolol 50 MG tablet Commonly known as:  TENORMIN Take 25 mg by mouth daily.   azithromycin 250 MG tablet Commonly known as:  ZITHROMAX Take by mouth See admin instructions. Takes one tablet  235m every  Monday, Wednesday,Friday each week  BREO ELLIPTA 200-25 MCG/INH Aepb Generic drug:  fluticasone furoate-vilanterol Inhale 1 puff into the lungs daily.   bumetanide 1 MG tablet Commonly known as:  BUMEX Take 1 mg by mouth every other day.   clonazePAM 1 MG tablet Commonly known as:  KLONOPIN Take 0.5 mg by mouth 2 (two) times daily.   dicyclomine 20 MG tablet Commonly known as:  BENTYL Take 20 mg by mouth 3 (three) times daily before meals.   diphenoxylate-atropine 2.5-0.025 MG tablet Commonly known as:  LOMOTIL Take 1 tablet by mouth 3 (three) times daily with meals.   ferrous sulfate 325 (65 FE) MG tablet Take 325 mg by mouth 2 (two) times daily with a  meal.   fluticasone 50 MCG/ACT nasal spray Commonly known as:  FLONASE Place 1 spray into both nostrils daily.   gabapentin 300 MG capsule Commonly known as:  NEURONTIN Take 300 mg by mouth 3 (three) times daily.   ibuprofen 800 MG tablet Commonly known as:  ADVIL,MOTRIN Take 800 mg by mouth every 6 (six) hours as needed for moderate pain.   isosorbide mononitrate 30 MG 24 hr tablet Commonly known as:  IMDUR Take 30 mg by mouth daily.   levothyroxine 150 MCG tablet Commonly known as:  SYNTHROID, LEVOTHROID Take 150 mcg by mouth daily before breakfast.   magnesium oxide 400 MG tablet Commonly known as:  MAG-OX Take 400-800 mg by mouth 2 (two) times daily. Patient taking 2 tablets (875m) in the morning and one tablet at night   metFORMIN 1000 MG tablet Commonly known as:  GLUCOPHAGE Take 500 mg by mouth 2 (two) times daily with a meal. Takes 5033m One-half tablet twice daily   montelukast 10 MG tablet Commonly known as:  SINGULAIR Take 10 mg by mouth at bedtime.   nitroGLYCERIN 0.4 MG SL tablet Commonly known as:  NITROSTAT Place 0.4 mg under the tongue every 5 (five) minutes as needed for chest pain.   nystatin cream Commonly known as:  MYCOSTATIN Apply 1 application topically 2 (two) times daily.   OXYGEN Inhale 2 L into the lungs at bedtime.   pantoprazole 40 MG tablet Commonly known as:  PROTONIX Take 40 mg by mouth daily.   potassium chloride 10 MEQ tablet Commonly known as:  K-DUR Take 10 mEq by mouth daily.   prazosin 1 MG capsule Commonly known as:  MINIPRESS Take 1 mg by mouth at bedtime.   simvastatin 20 MG tablet Commonly known as:  ZOCOR Take 20 mg by mouth daily at 6 PM.   sucralfate 1 g tablet Commonly known as:  CARAFATE Take 1 g by mouth 4 (four) times daily -  with meals and at bedtime.   topiramate 25 MG tablet Commonly known as:  TOPAMAX Take 25 mg by mouth at bedtime.   Valproic Acid 250 MG Cpdr Take 750-1,000 mg by mouth 2  (two) times daily. Takes four capsules (1000 mg) in the morning and 3 capsules (75075mat bedtime.   Venlafaxine HCl 150 MG Tb24 Take 150 mg by mouth daily.   vitamin B-12 1000 MCG tablet Commonly known as:  CYANOCOBALAMIN Take 1,000 mcg by mouth daily.   Vitamin D (Ergocalciferol) 50000 units Caps capsule Commonly known as:  DRISDOL Take 50,000 Units by mouth every 7 (seven) days.            Durable Medical Equipment  (From admission, onward)        Start     Ordered   05/17/17  1134  For home use only DME 3 n 1  Once     05/17/17 1134   05/16/17 0928  For home use only DME standard manual wheelchair with seat cushion  Once    Comments:  Patient suffers from LE which impairs their ability to perform daily activities like dressing in the home.  A walker will not resolve  issue with performing activities of daily living. A wheelchair will allow patient to safely perform daily activities. Patient can safely propel the wheelchair in the home or has a caregiver who can provide assistance.  Accessories: elevating leg rests (ELRs), wheel locks, extensions and anti-tippers.   05/16/17 0927     Allergies  Allergen Reactions  . Amoxicillin Hives  . Azithromycin Hives  . Corn-Containing Products Diarrhea  . Pea Extract Diarrhea  . Penicillins   . Sulfa Antibiotics    Follow-up Information    [facility] Follow up.   Why:  home health services arranged, office will call and set up home visits Contact information:         [phone]    fax # [phone]           The results of significant diagnostics from this hospitalization (including imaging, microbiology, ancillary and laboratory) are listed below for reference.    Significant Diagnostic Studies: Mr Thoracic Spine Wo Contrast  Result Date: 05/14/2017 CLINICAL DATA:  Initial evaluation for acute loss of feeling to bilateral lower extremities with fall last night. EXAM: MRI THORACIC AND LUMBAR  SPINE WITHOUT CONTRAST TECHNIQUE: Multiplanar and multiecho pulse sequences of the thoracic and lumbar spine were obtained without intravenous contrast. COMPARISON:  None. FINDINGS: MRI THORACIC SPINE FINDINGS Alignment: Vertebral bodies normally aligned with preservation of the normal thoracic kyphosis. No listhesis. Vertebrae: Vertebral body heights are maintained without evidence for acute or chronic fracture. Bone marrow signal intensity within normal limits. Mild reactive endplate changes noted about the T9-10 interspace anteriorly. Subcentimeter benign hemangioma noted within the T12 vertebral body. No other discrete or worrisome osseous lesions. Cord: Signal intensity within the thoracic spinal cord is normal. Conus medullaris terminates below the T12 level. Paraspinal and other soft tissues: Paraspinous soft tissues demonstrate no acute abnormality. Patchy opacity noted within the posterior right upper lobe, which may reflect atelectasis or infiltrate. Disc levels: No significant disc pathology seen within the thoracic spine. Mild bilateral facet hypertrophy seen from the T7-8 through T11-12 levels. No significant canal stenosis. Mild bilateral foraminal narrowing at T8-9. MRI LUMBAR SPINE FINDINGS Segmentation: Normal segmentation. Lowest well-formed disc labeled the L5-S1 level. Alignment: Trace retrolisthesis of L2 on L3. Vertebral bodies otherwise normally aligned with preservation of the normal lumbar lordosis. Vertebrae: Vertebral body heights are well maintained without evidence for acute or chronic fracture. Bone marrow signal intensity within normal limits. Mild reactive endplate changes noted about the L2-3 interspace anteriorly. No discrete or worrisome osseous lesions. No abnormal marrow edema. Conus medullaris and cauda equina: Conus extends to the L1 level. Conus and cauda equina appear normal. Paraspinal and other soft tissues: Paraspinous soft tissues within normal limits. Visualized visceral  structures unremarkable. Disc levels: L1-2:  Unremarkable. L2-3: Trace retrolisthesis. Mild diffuse disc bulge with disc desiccation and intervertebral disc space narrowing. Mild facet and ligament flavum hypertrophy. No significant canal or foraminal stenosis. L3-4: Mild diffuse disc bulge, slightly eccentric to the left. Mild bilateral facet and ligament flavum hypertrophy. Mild left lateral recess narrowing without significant canal stenosis. No significant foraminal encroachment. L4-5: Minimal disc bulge.  Mild to moderate facet and ligamentum flavum hypertrophy. Trace joint effusion present on the right. No significant canal stenosis. Mild right L4 foraminal narrowing. No significant left foraminal encroachment. L5-S1: Mild diffuse disc bulge with disc desiccation. Associated chronic reactive endplate changes with marginal endplate osteophytic spurring. Mild facet and ligament flavum hypertrophy. No significant canal or lateral recess stenosis. Mild bilateral L5 foraminal narrowing. IMPRESSION: MR THORACIC SPINE IMPRESSION 1. No acute abnormality within the thoracic spine. No evidence for cord compression. 2. Mild bilateral facet hypertrophy at T7-8 through T11-12 without significant stenosis. MR LUMBAR SPINE IMPRESSION 1. No acute abnormality within the lumbar spine. No evidence for cord compression. 2. Mild degenerative disc disease for age without significant canal stenosis. 3. Mild right L4 and bilateral L5 foraminal stenosis related to disc bulge and facet disease. Electronically Signed   By: Jeannine Boga M.D.   On: 05/14/2017 14:52   Mr Lumbar Spine Wo Contrast  Result Date: 05/14/2017 CLINICAL DATA:  Initial evaluation for acute loss of feeling to bilateral lower extremities with fall last night. EXAM: MRI THORACIC AND LUMBAR SPINE WITHOUT CONTRAST TECHNIQUE: Multiplanar and multiecho pulse sequences of the thoracic and lumbar spine were obtained without intravenous contrast. COMPARISON:   None. FINDINGS: MRI THORACIC SPINE FINDINGS Alignment: Vertebral bodies normally aligned with preservation of the normal thoracic kyphosis. No listhesis. Vertebrae: Vertebral body heights are maintained without evidence for acute or chronic fracture. Bone marrow signal intensity within normal limits. Mild reactive endplate changes noted about the T9-10 interspace anteriorly. Subcentimeter benign hemangioma noted within the T12 vertebral body. No other discrete or worrisome osseous lesions. Cord: Signal intensity within the thoracic spinal cord is normal. Conus medullaris terminates below the T12 level. Paraspinal and other soft tissues: Paraspinous soft tissues demonstrate no acute abnormality. Patchy opacity noted within the posterior right upper lobe, which may reflect atelectasis or infiltrate. Disc levels: No significant disc pathology seen within the thoracic spine. Mild bilateral facet hypertrophy seen from the T7-8 through T11-12 levels. No significant canal stenosis. Mild bilateral foraminal narrowing at T8-9. MRI LUMBAR SPINE FINDINGS Segmentation: Normal segmentation. Lowest well-formed disc labeled the L5-S1 level. Alignment: Trace retrolisthesis of L2 on L3. Vertebral bodies otherwise normally aligned with preservation of the normal lumbar lordosis. Vertebrae: Vertebral body heights are well maintained without evidence for acute or chronic fracture. Bone marrow signal intensity within normal limits. Mild reactive endplate changes noted about the L2-3 interspace anteriorly. No discrete or worrisome osseous lesions. No abnormal marrow edema. Conus medullaris and cauda equina: Conus extends to the L1 level. Conus and cauda equina appear normal. Paraspinal and other soft tissues: Paraspinous soft tissues within normal limits. Visualized visceral structures unremarkable. Disc levels: L1-2:  Unremarkable. L2-3: Trace retrolisthesis. Mild diffuse disc bulge with disc desiccation and intervertebral disc space  narrowing. Mild facet and ligament flavum hypertrophy. No significant canal or foraminal stenosis. L3-4: Mild diffuse disc bulge, slightly eccentric to the left. Mild bilateral facet and ligament flavum hypertrophy. Mild left lateral recess narrowing without significant canal stenosis. No significant foraminal encroachment. L4-5: Minimal disc bulge. Mild to moderate facet and ligamentum flavum hypertrophy. Trace joint effusion present on the right. No significant canal stenosis. Mild right L4 foraminal narrowing. No significant left foraminal encroachment. L5-S1: Mild diffuse disc bulge with disc desiccation. Associated chronic reactive endplate changes with marginal endplate osteophytic spurring. Mild facet and ligament flavum hypertrophy. No significant canal or lateral recess stenosis. Mild bilateral L5 foraminal narrowing. IMPRESSION: MR THORACIC SPINE IMPRESSION 1. No acute abnormality within  the thoracic spine. No evidence for cord compression. 2. Mild bilateral facet hypertrophy at T7-8 through T11-12 without significant stenosis. MR LUMBAR SPINE IMPRESSION 1. No acute abnormality within the lumbar spine. No evidence for cord compression. 2. Mild degenerative disc disease for age without significant canal stenosis. 3. Mild right L4 and bilateral L5 foraminal stenosis related to disc bulge and facet disease. Electronically Signed   By: Jeannine Boga M.D.   On: 05/14/2017 14:52    Microbiology: No results found for this or any previous visit (from the past 240 hour(s)).   Labs: Basic Metabolic Panel: Recent Labs  Lab 05/14/17 0950 05/15/17 0421  NA 136 136  K 4.0 4.0  CL 101 101  CO2 26 27  GLUCOSE 82 94  BUN 21* 18  CREATININE 1.14* 0.97  CALCIUM 8.7* 8.7*   Liver Function Tests: No results for input(s): AST, ALT, ALKPHOS, BILITOT, PROT, ALBUMIN in the last 168 hours. No results for input(s): LIPASE, AMYLASE in the last 168 hours. No results for input(s): AMMONIA in the last 168  hours. CBC: Recent Labs  Lab 05/14/17 0950 05/15/17 0421  WBC 3.2* 2.6*  NEUTROABS  --  1.1*  HGB 10.9* 10.9*  HCT 31.5* 32.3*  MCV 95.2 94.4  PLT 105* 112*   Cardiac Enzymes: Recent Labs  Lab 05/15/17 2124 05/16/17 0411  TROPONINI <0.03 <0.03   BNP: BNP (last 3 results) No results for input(s): BNP in the last 8760 hours.  ProBNP (last 3 results) No results for input(s): PROBNP in the last 8760 hours.  CBG: Recent Labs  Lab 05/15/17 0845 05/16/17 0809 05/17/17 0753  GLUCAP 92 105* 117*       Signed:  DAVID,RACHAL A MD.  [facility] 05/17/2017, 3:57 PM

## 2017-05-17 NOTE — Plan of Care (Signed)
  Activity: Risk for activity intolerance will decrease 05/17/2017 0448 - Progressing by Burtis Junesrewery, Nichalos Brenton, RN  Improved movement in bed

## 2017-05-17 NOTE — Progress Notes (Signed)
Physical Therapy Treatment Patient Details Name: Jamie Shea MRN: [id number] DOB: 22-Nov-1955 Today's Date: 05/17/2017    History of Present Illness Pt is a 62 y/o female who presented with B LE numbness and weakness, also pain in B LEs and falls over the past few months, as well as recent inability to walk. PMH OA, asthma, chronic pani, COPD, depression, DM, HTN, migranes, hx seizures, thyroid disease, hx heart cath     PT Comments    Pt seen for second session today at the request of pt's daughter and RN to provide pt with HEP. PT provided pt with general strengthening therex handout and demonstrated and instructed pt in all exercises. No family or caregivers present to educate them. Pt would continue to benefit from skilled physical therapy services at this time while admitted and after d/c to address the below listed limitations in order to improve overall safety and independence with functional mobility.    Follow Up Recommendations  Home health PT;Supervision/Assistance - 24 hour     Equipment Recommendations  Wheelchair (measurements PT);Wheelchair cushion (measurements PT);3in1 (PT)    Recommendations for Other Services       Precautions / Restrictions Precautions Precautions: Fall Restrictions Weight Bearing Restrictions: No    Mobility  Bed Mobility                  Transfers                    Ambulation/Gait                 Stairs            Wheelchair Mobility    Modified Rankin (Stroke Patients Only)       Balance                                            Cognition Arousal/Alertness: Awake/alert Behavior During Therapy: WFL for tasks assessed/performed Overall Cognitive Status: Within Functional Limits for tasks assessed                                        Exercises      General Comments General comments (skin integrity, edema, etc.): Pt OOB in recliner chair eating her lunch  upon arrival with RN present. PT returning for a second session to provide pt with general strengthening HEP per pt's daughter and RN's request. PT demonstrated and instructed pt in HEP and provided handout. Pt with poor attention and awareness with no caregiver present to educate.      Pertinent Vitals/Pain Pain Assessment: No/denies pain    Home Living                      Prior Function            PT Goals (current goals can now be found in the care plan section) Acute Rehab PT Goals PT Goal Formulation: With patient Time For Goal Achievement: 05/29/17 Potential to Achieve Goals: Fair Progress towards PT goals: Progressing toward goals    Frequency    Min 3X/week      PT Plan Current plan remains appropriate    Co-evaluation              AM-PAC PT "6  Clicks" Daily Activity  Outcome Measure  Difficulty turning over in bed (including adjusting bedclothes, sheets and blankets)?: Unable Difficulty moving from lying on back to sitting on the side of the bed? : Unable Difficulty sitting down on and standing up from a chair with arms (e.g., wheelchair, bedside commode, etc,.)?: Unable Help needed moving to and from a bed to chair (including a wheelchair)?: A Little Help needed walking in hospital room?: Total Help needed climbing 3-5 steps with a railing? : Total 6 Click Score: 8    End of Session   Activity Tolerance: Patient limited by fatigue Patient left: in chair;with call bell/phone within reach;with chair alarm set Nurse Communication: Mobility status PT Visit Diagnosis: Muscle weakness (generalized) (M62.81);Other abnormalities of gait and mobility (R26.89);History of falling (Z91.81);Other symptoms and signs involving the nervous system (R29.898)     Time: [id number] PT Time Calculation (min) (ACUTE ONLY): 10 min  Charges:  $Self Care/Home Management: 8-22                    G Codes:       Deborah ChalkJennifer Audrina Marten, PT, DPT [phone]    Alessandra BevelsJennifer M  [name] 05/17/2017, 5:00 PM

## 2017-05-17 NOTE — Progress Notes (Signed)
Physical Therapy Treatment Patient Details Name: Jamie PurserJoyce Ann Borello MRN: [id number] DOB: 05/26/1955 Today's Date: 05/17/2017    History of Present Illness Pt is a 62 y/o female who presented with B LE numbness and weakness, also pain in B LEs and falls over the past few months, as well as recent inability to walk. PMH OA, asthma, chronic pani, COPD, depression, DM, HTN, migranes, hx seizures, thyroid disease, hx heart cath     PT Comments    Pt continues to be very limited secondary to bilateral LE weakness and fatigue. Pt only able to tolerate and perform sit<>stand and stand pivot transfers with min A. Plan is for pt to d/c home today as pt was denied for SNF. Pt would greatly benefit from continued skilled PT services to address her limitations in order to improve her overall independence with functional mobility.  Patient presents with bilateral LE weakness which impairs her ability to perform daily activities in the home.  A walker alone will not resolve the issues with performing activities of daily living. Pt requires a wheelchair which will allow the patient to safely perform daily activities. The patient can self propel in the home or has a caregiver who can provide assistance.     Follow Up Recommendations  Home health PT;Supervision/Assistance - 24 hour     Equipment Recommendations  Wheelchair (measurements PT);Wheelchair cushion (measurements PT);3in1 (PT)    Recommendations for Other Services       Precautions / Restrictions Precautions Precautions: Fall Restrictions Weight Bearing Restrictions: No    Mobility  Bed Mobility Overal bed mobility: Needs Assistance Bed Mobility: Supine to Sit     Supine to sit: Mod assist     General bed mobility comments: increased time and effort, use of bed rails, assist with bilateral LE movement and to elevate trunk  Transfers Overall transfer level: Needs assistance Equipment used: Rolling walker (2 wheeled) Transfers: Sit  to/from [employer] CorporationStand;Stand Pivot Transfers Sit to Stand: Min guard Stand pivot transfers: Min assist       General transfer comment: increased time and effort, min guard for safety with transitional movement into standing from EOB, min A with pivotal movement and cueing for safety  Ambulation/Gait                 Stairs            Wheelchair Mobility    Modified Rankin (Stroke Patients Only)       Balance Overall balance assessment: History of Falls;Needs assistance Sitting-balance support: Feet supported Sitting balance-Leahy Scale: Fair     Standing balance support: During functional activity;Bilateral upper extremity supported Standing balance-Leahy Scale: Poor Standing balance comment: reliant on bilateral UE supports on RW                            Cognition Arousal/Alertness: Awake/alert Behavior During Therapy: WFL for tasks assessed/performed Overall Cognitive Status: Within Functional Limits for tasks assessed                                        Exercises      General Comments        Pertinent Vitals/Pain Pain Assessment: No/denies pain    Home Living                      Prior Function  PT Goals (current goals can now be found in the care plan section) Acute Rehab PT Goals PT Goal Formulation: With patient Time For Goal Achievement: 05/29/17 Potential to Achieve Goals: Fair Progress towards PT goals: Progressing toward goals    Frequency    Min 3X/week      PT Plan Discharge plan needs to be updated;Frequency needs to be updated    Co-evaluation              AM-PAC PT "6 Clicks" Daily Activity  Outcome Measure  Difficulty turning over in bed (including adjusting bedclothes, sheets and blankets)?: Unable Difficulty moving from lying on back to sitting on the side of the bed? : Unable Difficulty sitting down on and standing up from a chair with arms (e.g., wheelchair,  bedside commode, etc,.)?: Unable Help needed moving to and from a bed to chair (including a wheelchair)?: A Little Help needed walking in hospital room?: Total Help needed climbing 3-5 steps with a railing? : Total 6 Click Score: 8    End of Session   Activity Tolerance: Patient limited by fatigue Patient left: in chair;with call bell/phone within reach;with chair alarm set Nurse Communication: Mobility status PT Visit Diagnosis: Muscle weakness (generalized) (M62.81);Other abnormalities of gait and mobility (R26.89);History of falling (Z91.81);Other symptoms and signs involving the nervous system (R29.898)     Time: 1610-9604 PT Time Calculation (min) (ACUTE ONLY): 13 min  Charges:  $Therapeutic Activity: 8-22 mins                    G Codes:       [facility], [place], [place] [phone]    [name] Shishir Krantz 05/17/2017, 12:53 PM

## 2018-07-14 IMAGING — MR MR THORACIC SPINE W/O CM
4 of 11 series · 17 of 48 positions shown · non-contrast
Comparison: None.

CLINICAL DATA: Initial evaluation for acute loss of feeling to
bilateral lower extremities with fall last night.

EXAM:
MRI THORACIC AND LUMBAR SPINE WITHOUT CONTRAST
TECHNIQUE: Multiplanar and multiecho pulse sequences of the thoracic and lumbar
spine were obtained without intravenous contrast.

[Series 4: T2 · sagittal · 3.0mm · 0.62mm/px · 2 of 13 slices shown (1 of 4)]
[im 1/13]
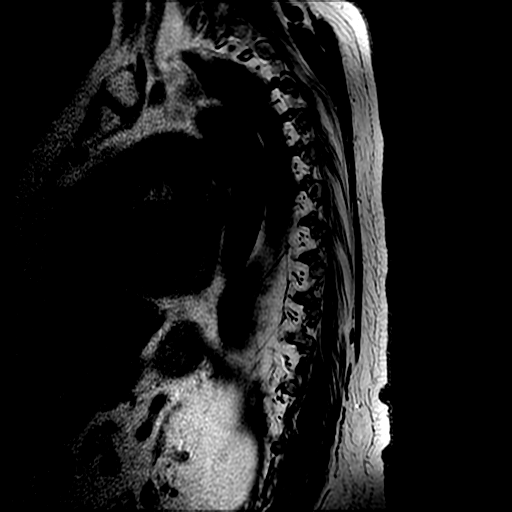
[im 13/13]
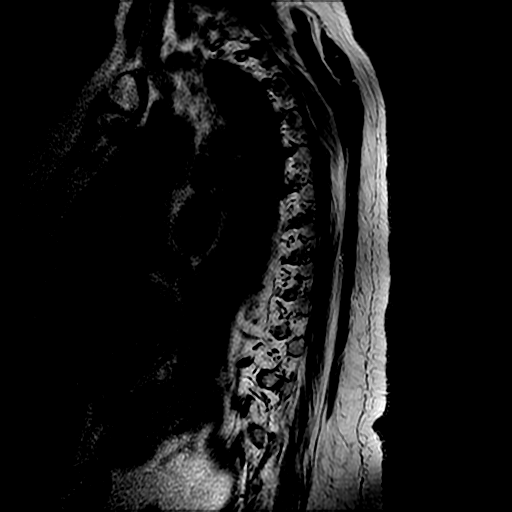

[Series 8: T2 · axial · 4.0mm · 0.43mm/px · z∈[-155,+44]mm · 8 of 39 slices shown (2 of 4)]
[im 1/39]
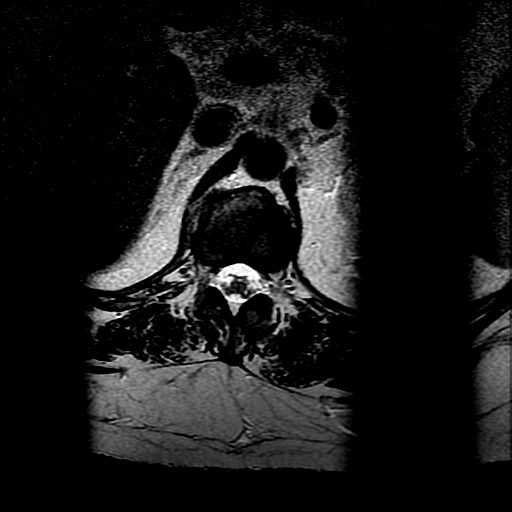
[im 6/39]
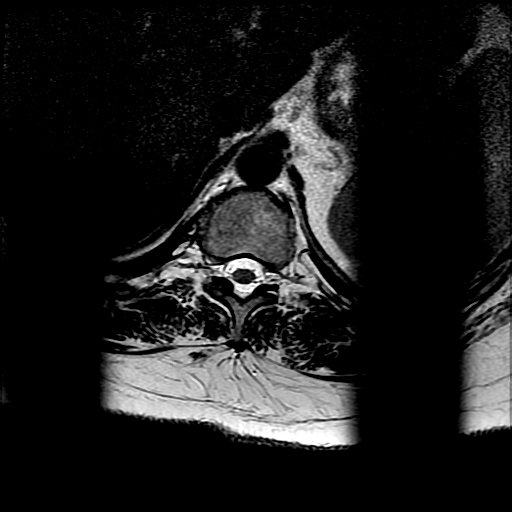
[im 11/39]
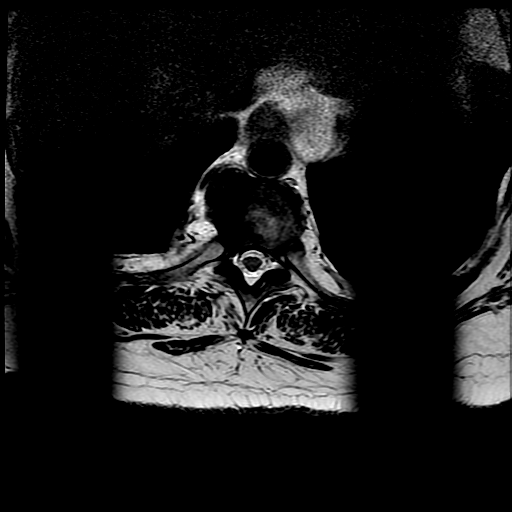
[im 17/39]
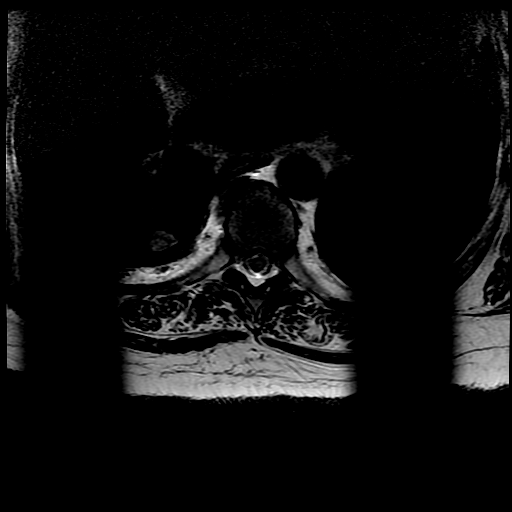
[im 22/39]
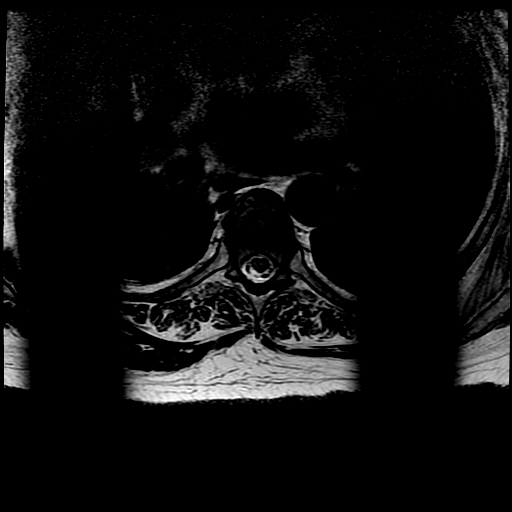
[im 28/39]
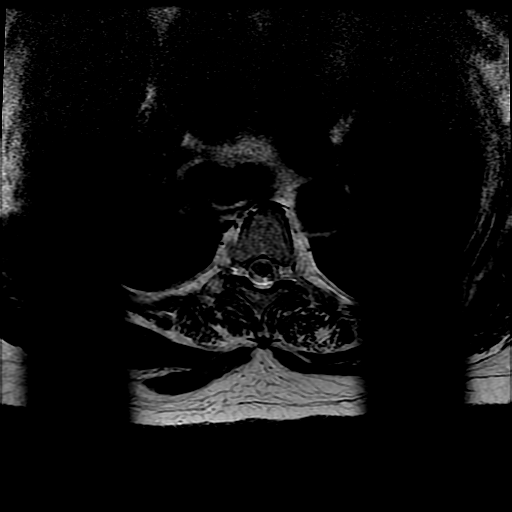
[im 33/39]
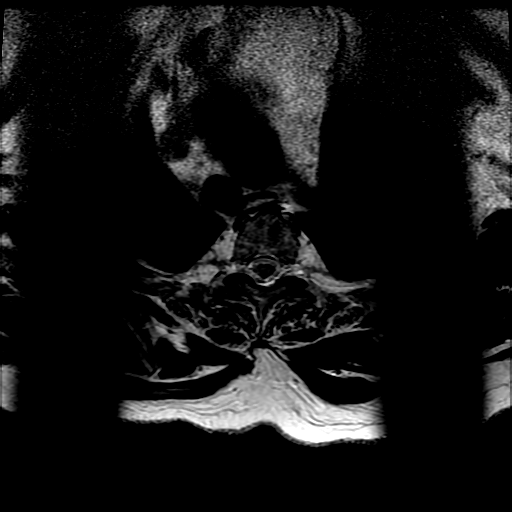
[im 39/39]
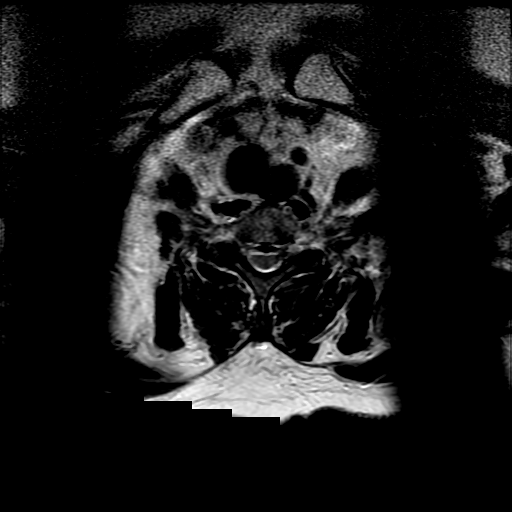

[Series 11: T2 · sagittal · 4.0mm · 0.55mm/px · 3 of 13 slices shown (3 of 4)]
[im 1/13]
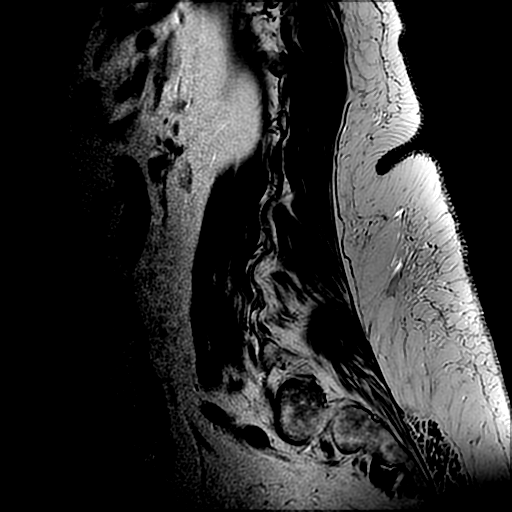
[im 7/13]
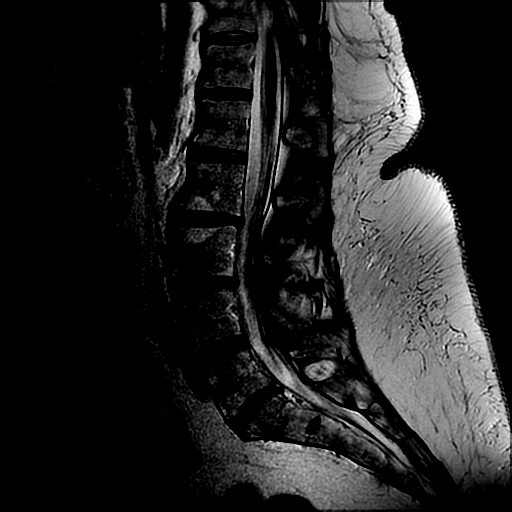
[im 13/13]
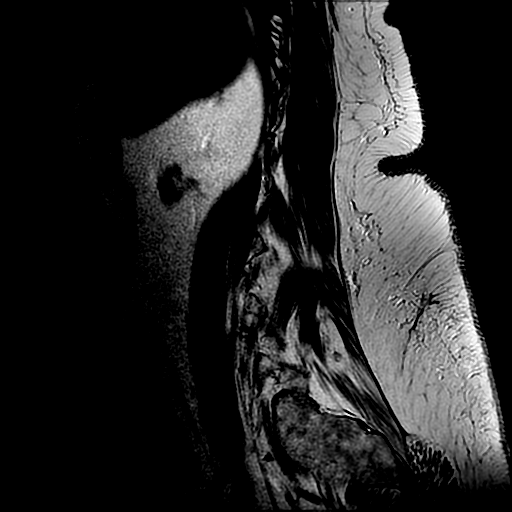

[Series 14: T2 · axial · 4.0mm · 0.39mm/px · z∈[-373,-196]mm · 4 of 36 slices shown (4 of 4)]
[im 1/36]
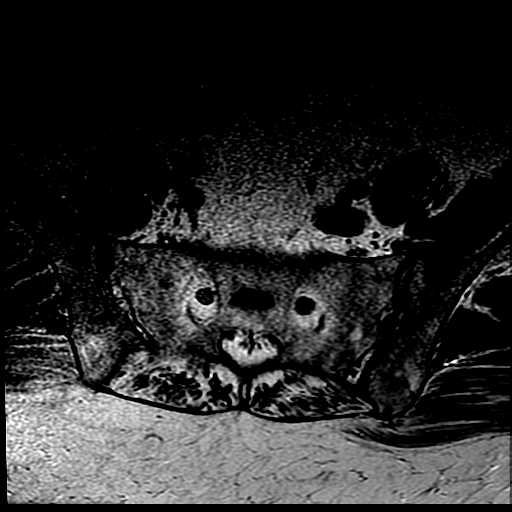
[im 6/36]
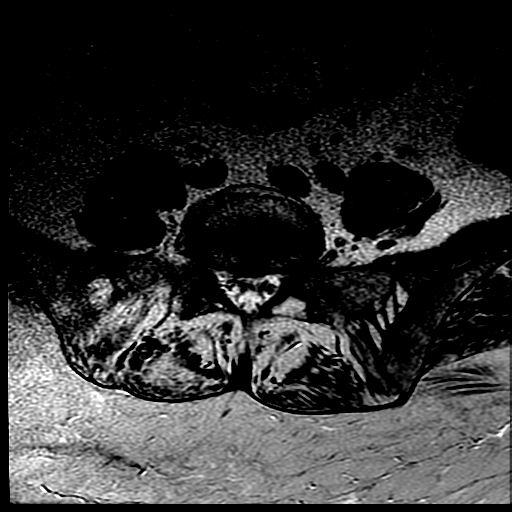
[im 18/36]
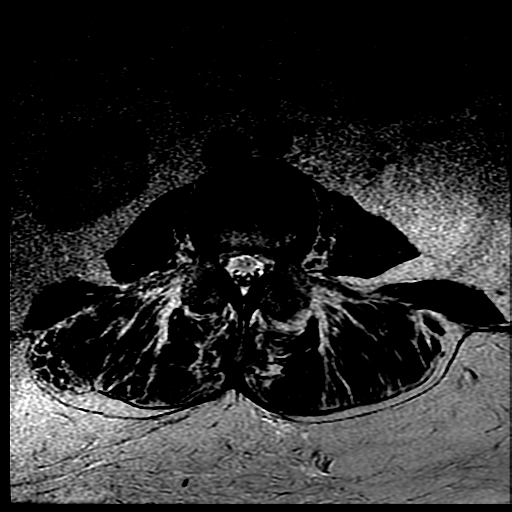
[im 30/36]
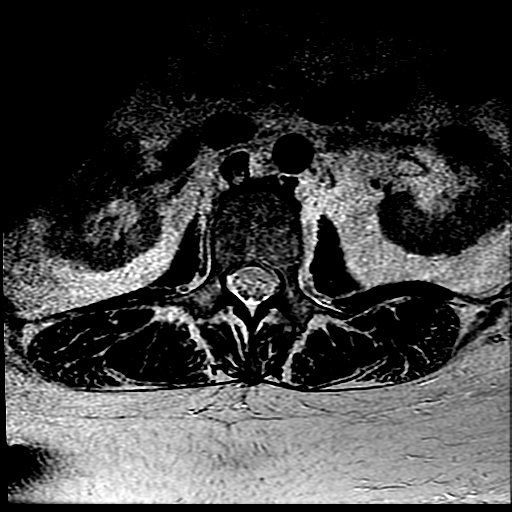

[17 of 48 positions shown; findings below may reference images not displayed]

FINDINGS: MRI THORACIC SPINE FINDINGS

Alignment: Vertebral bodies normally aligned with preservation of
the normal thoracic kyphosis. No listhesis.

Vertebrae: Vertebral body heights are maintained without evidence
for acute or chronic fracture. Bone marrow signal intensity within
normal limits. Mild reactive endplate changes noted about the T9-10
interspace anteriorly. Subcentimeter benign hemangioma noted within
the T12 vertebral body. No other discrete or worrisome osseous
lesions.

Cord: Signal intensity within the thoracic spinal cord is normal.
Conus medullaris terminates below the T12 level.

Paraspinal and other soft tissues: Paraspinous soft tissues
demonstrate no acute abnormality. Patchy opacity noted within the
posterior right upper lobe, which may reflect atelectasis or
infiltrate.

Disc levels:

No significant disc pathology seen within the thoracic spine. Mild
bilateral facet hypertrophy seen from the T7-8 through T11-12
levels. No significant canal stenosis. Mild bilateral foraminal
narrowing at T8-9.

MRI LUMBAR SPINE FINDINGS

Segmentation: Normal segmentation. Lowest well-formed disc labeled
the L5-S1 level.

Alignment: Trace retrolisthesis of L2 on L3. Vertebral bodies
otherwise normally aligned with preservation of the normal lumbar
lordosis.

Vertebrae: Vertebral body heights are well maintained without
evidence for acute or chronic fracture. Bone marrow signal intensity
within normal limits. Mild reactive endplate changes noted about the
L2-3 interspace anteriorly. No discrete or worrisome osseous
lesions. No abnormal marrow edema.

Conus medullaris and cauda equina: Conus extends to the L1 level.
Conus and cauda equina appear normal.

Paraspinal and other soft tissues: Paraspinous soft tissues within
normal limits. Visualized visceral structures unremarkable.

Disc levels:

L1-2:  Unremarkable.

L2-3: Trace retrolisthesis. Mild diffuse disc bulge with disc
desiccation and intervertebral disc space narrowing. Mild facet and
ligament flavum hypertrophy. No significant canal or foraminal
stenosis.

L3-4: Mild diffuse disc bulge, slightly eccentric to the left. Mild
bilateral facet and ligament flavum hypertrophy. Mild left lateral
recess narrowing without significant canal stenosis. No significant
foraminal encroachment.

L4-5: Minimal disc bulge. Mild to moderate facet and ligamentum
flavum hypertrophy. Trace joint effusion present on the right. No
significant canal stenosis. Mild right L4 foraminal narrowing. No
significant left foraminal encroachment.

L5-S1: Mild diffuse disc bulge with disc desiccation. Associated
chronic reactive endplate changes with marginal endplate osteophytic
spurring. Mild facet and ligament flavum hypertrophy. No significant
canal or lateral recess stenosis. Mild bilateral L5 foraminal
narrowing.
IMPRESSION: MR THORACIC SPINE IMPRESSION

1. No acute abnormality within the thoracic spine. No evidence for
cord compression.
2. Mild bilateral facet hypertrophy at T7-8 through T11-12 without
significant stenosis.

MR LUMBAR SPINE IMPRESSION

1. No acute abnormality within the lumbar spine. No evidence for
cord compression.
2. Mild degenerative disc disease for age without significant canal
stenosis.
3. Mild right L4 and bilateral L5 foraminal stenosis related to disc
bulge and facet disease.

## 2021-05-29 ENCOUNTER — Other Ambulatory Visit: Payer: Self-pay | Admitting: Family Medicine

## 2021-05-29 DIAGNOSIS — R079 Chest pain, unspecified: Secondary | ICD-10-CM
